# Patient Record
Sex: Female | Born: 1980 | Race: White | Hispanic: No | Marital: Single | Smoking: Former smoker
Health system: Southern US, Community
[De-identification: ages and names within clinical notes are randomized; demographics above are authoritative.]

## PROBLEM LIST (undated history)

## (undated) DIAGNOSIS — F419 Anxiety disorder, unspecified: Secondary | ICD-10-CM

## (undated) DIAGNOSIS — R87619 Unspecified abnormal cytological findings in specimens from cervix uteri: Secondary | ICD-10-CM

## (undated) DIAGNOSIS — F17200 Nicotine dependence, unspecified, uncomplicated: Secondary | ICD-10-CM

## (undated) DIAGNOSIS — A630 Anogenital (venereal) warts: Secondary | ICD-10-CM

## (undated) DIAGNOSIS — J029 Acute pharyngitis, unspecified: Secondary | ICD-10-CM

## (undated) DIAGNOSIS — K219 Gastro-esophageal reflux disease without esophagitis: Secondary | ICD-10-CM

## (undated) DIAGNOSIS — F32A Depression, unspecified: Secondary | ICD-10-CM

## (undated) DIAGNOSIS — F509 Eating disorder, unspecified: Secondary | ICD-10-CM

## (undated) DIAGNOSIS — F329 Major depressive disorder, single episode, unspecified: Secondary | ICD-10-CM

## (undated) DIAGNOSIS — N912 Amenorrhea, unspecified: Secondary | ICD-10-CM

## (undated) DIAGNOSIS — F1020 Alcohol dependence, uncomplicated: Secondary | ICD-10-CM

## (undated) DIAGNOSIS — J019 Acute sinusitis, unspecified: Secondary | ICD-10-CM

## (undated) HISTORY — DX: Acute pharyngitis, unspecified: J02.9

## (undated) HISTORY — DX: Eating disorder, unspecified: F50.9

## (undated) HISTORY — PX: COLPOSCOPY: SHX161

## (undated) HISTORY — DX: Anxiety disorder, unspecified: F41.9

## (undated) HISTORY — DX: Gastro-esophageal reflux disease without esophagitis: K21.9

## (undated) HISTORY — DX: Anogenital (venereal) warts: A63.0

## (undated) HISTORY — DX: Depression, unspecified: F32.A

## (undated) HISTORY — DX: Amenorrhea, unspecified: N91.2

## (undated) HISTORY — DX: Alcohol dependence, uncomplicated: F10.20

## (undated) HISTORY — PX: WISDOM TOOTH EXTRACTION: SHX21

## (undated) HISTORY — DX: Acute sinusitis, unspecified: J01.90

## (undated) HISTORY — DX: Nicotine dependence, unspecified, uncomplicated: F17.200

## (undated) HISTORY — DX: Unspecified abnormal cytological findings in specimens from cervix uteri: R87.619

## (undated) HISTORY — DX: Major depressive disorder, single episode, unspecified: F32.9

---
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
## 2001-01-30 ENCOUNTER — Other Ambulatory Visit: Admission: RE | Admit: 2001-01-30 | Discharge: 2001-01-30 | Payer: Self-pay | Admitting: Family Medicine

## 2001-07-30 ENCOUNTER — Emergency Department (HOSPITAL_COMMUNITY): Admission: EM | Admit: 2001-07-30 | Discharge: 2001-07-30 | Payer: Self-pay | Admitting: Emergency Medicine

## 2001-12-28 ENCOUNTER — Other Ambulatory Visit: Admission: RE | Admit: 2001-12-28 | Discharge: 2001-12-28 | Payer: Self-pay | Admitting: Family Medicine

## 2002-08-14 ENCOUNTER — Encounter: Payer: Self-pay | Admitting: Emergency Medicine

## 2002-08-14 ENCOUNTER — Emergency Department (HOSPITAL_COMMUNITY): Admission: EM | Admit: 2002-08-14 | Discharge: 2002-08-14 | Payer: Self-pay | Admitting: Emergency Medicine

## 2003-06-11 ENCOUNTER — Other Ambulatory Visit: Admission: RE | Admit: 2003-06-11 | Discharge: 2003-06-11 | Payer: Self-pay | Admitting: Family Medicine

## 2004-08-19 ENCOUNTER — Emergency Department (HOSPITAL_COMMUNITY): Admission: EM | Admit: 2004-08-19 | Discharge: 2004-08-19 | Payer: Self-pay | Admitting: *Deleted

## 2005-01-22 ENCOUNTER — Ambulatory Visit: Payer: Self-pay | Admitting: Family Medicine

## 2005-03-25 ENCOUNTER — Ambulatory Visit: Payer: Self-pay | Admitting: Family Medicine

## 2005-03-30 ENCOUNTER — Other Ambulatory Visit: Admission: RE | Admit: 2005-03-30 | Discharge: 2005-03-30 | Payer: Self-pay | Admitting: Obstetrics & Gynecology

## 2005-09-23 ENCOUNTER — Ambulatory Visit: Payer: Self-pay | Admitting: Family Medicine

## 2005-09-23 ENCOUNTER — Other Ambulatory Visit: Admission: RE | Admit: 2005-09-23 | Discharge: 2005-09-23 | Payer: Self-pay | Admitting: Family Medicine

## 2005-09-23 ENCOUNTER — Encounter: Payer: Self-pay | Admitting: Family Medicine

## 2005-11-23 ENCOUNTER — Ambulatory Visit: Payer: Self-pay | Admitting: Family Medicine

## 2005-12-20 ENCOUNTER — Ambulatory Visit: Payer: Self-pay | Admitting: Family Medicine

## 2005-12-28 ENCOUNTER — Ambulatory Visit: Payer: Self-pay | Admitting: Family Medicine

## 2005-12-30 ENCOUNTER — Encounter: Admission: RE | Admit: 2005-12-30 | Discharge: 2005-12-30 | Payer: Self-pay | Admitting: Family Medicine

## 2007-02-20 ENCOUNTER — Encounter: Payer: Self-pay | Admitting: Family Medicine

## 2007-02-20 ENCOUNTER — Other Ambulatory Visit: Admission: RE | Admit: 2007-02-20 | Discharge: 2007-02-20 | Payer: Self-pay | Admitting: Family Medicine

## 2007-02-20 ENCOUNTER — Ambulatory Visit: Payer: Self-pay | Admitting: Family Medicine

## 2007-02-20 LAB — CONVERTED CEMR LAB
ALT: 13 units/L (ref 0–40)
AST: 23 units/L (ref 0–37)
Albumin: 3.8 g/dL (ref 3.5–5.2)
Alkaline Phosphatase: 39 units/L (ref 39–117)
BUN: 12 mg/dL (ref 6–23)
Basophils Absolute: 0.1 10*3/uL (ref 0.0–0.1)
Basophils Relative: 0.8 % (ref 0.0–1.0)
Bilirubin, Direct: 0.2 mg/dL (ref 0.0–0.3)
CO2: 27 meq/L (ref 19–32)
Calcium: 9.1 mg/dL (ref 8.4–10.5)
Chloride: 105 meq/L (ref 96–112)
Cholesterol: 146 mg/dL (ref 0–200)
Creatinine, Ser: 0.8 mg/dL (ref 0.4–1.2)
Eosinophils Absolute: 0.3 10*3/uL (ref 0.0–0.6)
Eosinophils Relative: 3.1 % (ref 0.0–5.0)
GFR calc Af Amer: 113 mL/min
GFR calc non Af Amer: 94 mL/min
Glucose, Bld: 51 mg/dL — ABNORMAL LOW (ref 70–99)
HCT: 41.4 % (ref 36.0–46.0)
HDL: 80 mg/dL (ref >39.0)
Hemoglobin: 14.5 g/dL (ref 12.0–15.0)
LDL Cholesterol: 49 mg/dL (ref 0–99)
Lymphocytes Relative: 11.4 % — ABNORMAL LOW (ref 12.0–46.0)
MCHC: 34.9 g/dL (ref 30.0–36.0)
MCV: 92.4 fL (ref 78.0–100.0)
Monocytes Absolute: 0.3 10*3/uL (ref 0.2–0.7)
Monocytes Relative: 3.9 % (ref 3.0–11.0)
Neutro Abs: 7.2 10*3/uL (ref 1.4–7.7)
Neutrophils Relative %: 80.8 % — ABNORMAL HIGH (ref 43.0–77.0)
Platelets: 279 10*3/uL (ref 150–400)
Potassium: 3.8 meq/L (ref 3.5–5.1)
RBC: 4.48 M/uL (ref 3.87–5.11)
RDW: 12.3 % (ref 11.5–14.6)
Sodium: 138 meq/L (ref 135–145)
TSH: 1.04 microintl units/mL (ref 0.35–5.50)
Total Bilirubin: 0.6 mg/dL (ref 0.3–1.2)
Total CHOL/HDL Ratio: 1.8
Total Protein: 6.5 g/dL (ref 6.0–8.3)
Triglycerides: 83 mg/dL (ref 0–149)
VLDL: 17 mg/dL (ref 0–40)
WBC: 8.9 10*3/uL (ref 4.5–10.5)

## 2007-12-14 ENCOUNTER — Ambulatory Visit: Payer: Self-pay | Admitting: Internal Medicine

## 2007-12-14 DIAGNOSIS — N912 Amenorrhea, unspecified: Secondary | ICD-10-CM | POA: Insufficient documentation

## 2007-12-14 LAB — CONVERTED CEMR LAB: Beta hcg, urine, semiquantitative: POSITIVE

## 2008-01-04 ENCOUNTER — Encounter: Payer: Self-pay | Admitting: Internal Medicine

## 2008-01-08 ENCOUNTER — Telehealth (INDEPENDENT_AMBULATORY_CARE_PROVIDER_SITE_OTHER): Payer: Self-pay | Admitting: *Deleted

## 2008-02-13 ENCOUNTER — Telehealth (INDEPENDENT_AMBULATORY_CARE_PROVIDER_SITE_OTHER): Payer: Self-pay | Admitting: *Deleted

## 2008-02-13 ENCOUNTER — Ambulatory Visit: Payer: Self-pay | Admitting: Internal Medicine

## 2008-02-13 DIAGNOSIS — J029 Acute pharyngitis, unspecified: Secondary | ICD-10-CM | POA: Insufficient documentation

## 2008-02-13 DIAGNOSIS — J019 Acute sinusitis, unspecified: Secondary | ICD-10-CM | POA: Insufficient documentation

## 2008-02-13 LAB — CONVERTED CEMR LAB: Rapid Strep: NEGATIVE

## 2008-04-01 ENCOUNTER — Telehealth (INDEPENDENT_AMBULATORY_CARE_PROVIDER_SITE_OTHER): Payer: Self-pay | Admitting: *Deleted

## 2009-04-10 ENCOUNTER — Encounter: Payer: Self-pay | Admitting: Family Medicine

## 2009-04-11 ENCOUNTER — Encounter: Payer: Self-pay | Admitting: Family Medicine

## 2009-04-11 ENCOUNTER — Ambulatory Visit: Payer: Self-pay | Admitting: Family Medicine

## 2009-04-11 ENCOUNTER — Other Ambulatory Visit: Admission: RE | Admit: 2009-04-11 | Discharge: 2009-04-11 | Payer: Self-pay | Admitting: Family Medicine

## 2009-04-13 LAB — CONVERTED CEMR LAB
ALT: 11 units/L (ref 0–35)
AST: 20 units/L (ref 0–37)
Albumin: 4.1 g/dL (ref 3.5–5.2)
Alkaline Phosphatase: 35 units/L — ABNORMAL LOW (ref 39–117)
BUN: 10 mg/dL (ref 6–23)
Basophils Absolute: 0 10*3/uL (ref 0.0–0.1)
Basophils Relative: 0 % (ref 0.0–3.0)
Bilirubin, Direct: 0 mg/dL (ref 0.0–0.3)
CO2: 29 meq/L (ref 19–32)
Calcium: 9.3 mg/dL (ref 8.4–10.5)
Chloride: 106 meq/L (ref 96–112)
Cholesterol: 163 mg/dL (ref 0–200)
Creatinine, Ser: 0.7 mg/dL (ref 0.4–1.2)
Eosinophils Absolute: 0.2 10*3/uL (ref 0.0–0.7)
Eosinophils Relative: 3.1 % (ref 0.0–5.0)
GFR calc non Af Amer: 107.04 mL/min (ref >60)
Glucose, Bld: 76 mg/dL (ref 70–99)
HCT: 41.3 % (ref 36.0–46.0)
HDL: 75.8 mg/dL (ref >39.00)
Hemoglobin: 14.3 g/dL (ref 12.0–15.0)
LDL Cholesterol: 61 mg/dL (ref 0–99)
Lymphocytes Relative: 26 % (ref 12.0–46.0)
Lymphs Abs: 1.4 10*3/uL (ref 0.7–4.0)
MCHC: 34.7 g/dL (ref 30.0–36.0)
MCV: 91.5 fL (ref 78.0–100.0)
Monocytes Absolute: 0.5 10*3/uL (ref 0.1–1.0)
Monocytes Relative: 8.2 % (ref 3.0–12.0)
Neutro Abs: 3.4 10*3/uL (ref 1.4–7.7)
Neutrophils Relative %: 62.7 % (ref 43.0–77.0)
Platelets: 257 10*3/uL (ref 150.0–400.0)
Potassium: 3.7 meq/L (ref 3.5–5.1)
RBC: 4.52 M/uL (ref 3.87–5.11)
RDW: 12.1 % (ref 11.5–14.6)
Sodium: 139 meq/L (ref 135–145)
TSH: 1.3 microintl units/mL (ref 0.35–5.50)
Total Bilirubin: 0.8 mg/dL (ref 0.3–1.2)
Total CHOL/HDL Ratio: 2
Total Protein: 7.5 g/dL (ref 6.0–8.3)
Triglycerides: 132 mg/dL (ref 0.0–149.0)
VLDL: 26.4 mg/dL (ref 0.0–40.0)
WBC: 5.5 10*3/uL (ref 4.5–10.5)

## 2009-04-14 ENCOUNTER — Encounter (INDEPENDENT_AMBULATORY_CARE_PROVIDER_SITE_OTHER): Payer: Self-pay | Admitting: *Deleted

## 2009-04-15 ENCOUNTER — Encounter (INDEPENDENT_AMBULATORY_CARE_PROVIDER_SITE_OTHER): Payer: Self-pay | Admitting: *Deleted

## 2009-04-15 LAB — CONVERTED CEMR LAB
Mumps IgG: 4 — ABNORMAL HIGH
Rubella: 39.5 intl units/mL — ABNORMAL HIGH
Rubeola IgG: 3.16 — ABNORMAL HIGH
Varicella IgG: 3.94 — ABNORMAL HIGH

## 2009-05-01 ENCOUNTER — Ambulatory Visit: Payer: Self-pay | Admitting: Family Medicine

## 2009-05-01 ENCOUNTER — Telehealth (INDEPENDENT_AMBULATORY_CARE_PROVIDER_SITE_OTHER): Payer: Self-pay | Admitting: *Deleted

## 2009-12-03 ENCOUNTER — Telehealth (INDEPENDENT_AMBULATORY_CARE_PROVIDER_SITE_OTHER): Payer: Self-pay | Admitting: *Deleted

## 2009-12-03 ENCOUNTER — Encounter (INDEPENDENT_AMBULATORY_CARE_PROVIDER_SITE_OTHER): Payer: Self-pay | Admitting: *Deleted

## 2010-01-26 ENCOUNTER — Other Ambulatory Visit: Admission: RE | Admit: 2010-01-26 | Discharge: 2010-01-26 | Payer: Self-pay | Admitting: Family Medicine

## 2010-01-26 ENCOUNTER — Ambulatory Visit: Payer: Self-pay | Admitting: Family Medicine

## 2010-01-26 DIAGNOSIS — F17200 Nicotine dependence, unspecified, uncomplicated: Secondary | ICD-10-CM | POA: Insufficient documentation

## 2010-01-26 LAB — CONVERTED CEMR LAB
ALT: 12 units/L (ref 0–35)
AST: 17 units/L (ref 0–37)
Albumin: 4 g/dL (ref 3.5–5.2)
Alkaline Phosphatase: 45 units/L (ref 39–117)
BUN: 12 mg/dL (ref 6–23)
Basophils Absolute: 0 10*3/uL (ref 0.0–0.1)
Basophils Relative: 0.3 % (ref 0.0–3.0)
Bilirubin, Direct: 0.1 mg/dL (ref 0.0–0.3)
CO2: 29 meq/L (ref 19–32)
Calcium: 9.5 mg/dL (ref 8.4–10.5)
Chloride: 108 meq/L (ref 96–112)
Cholesterol: 149 mg/dL (ref 0–200)
Creatinine, Ser: 0.8 mg/dL (ref 0.4–1.2)
Eosinophils Absolute: 0.2 10*3/uL (ref 0.0–0.7)
Eosinophils Relative: 2.3 % (ref 0.0–5.0)
GFR calc non Af Amer: 91.21 mL/min (ref >60)
Glucose, Bld: 88 mg/dL (ref 70–99)
HCT: 41.1 % (ref 36.0–46.0)
HDL: 72.7 mg/dL (ref >39.00)
Hemoglobin: 13.7 g/dL (ref 12.0–15.0)
LDL Cholesterol: 52 mg/dL (ref 0–99)
Lymphocytes Relative: 18.2 % (ref 12.0–46.0)
Lymphs Abs: 1.4 10*3/uL (ref 0.7–4.0)
MCHC: 33.3 g/dL (ref 30.0–36.0)
MCV: 92.4 fL (ref 78.0–100.0)
Monocytes Absolute: 0.6 10*3/uL (ref 0.1–1.0)
Monocytes Relative: 7.3 % (ref 3.0–12.0)
Neutro Abs: 5.4 10*3/uL (ref 1.4–7.7)
Neutrophils Relative %: 71.9 % (ref 43.0–77.0)
Platelets: 331 10*3/uL (ref 150.0–400.0)
Potassium: 4.4 meq/L (ref 3.5–5.1)
RBC: 4.45 M/uL (ref 3.87–5.11)
RDW: 11.9 % (ref 11.5–14.6)
Sodium: 141 meq/L (ref 135–145)
TSH: 1.05 microintl units/mL (ref 0.35–5.50)
Total Bilirubin: 0.3 mg/dL (ref 0.3–1.2)
Total CHOL/HDL Ratio: 2
Total Protein: 7 g/dL (ref 6.0–8.3)
Triglycerides: 120 mg/dL (ref 0.0–149.0)
VLDL: 24 mg/dL (ref 0.0–40.0)
WBC: 7.6 10*3/uL (ref 4.5–10.5)

## 2010-01-28 ENCOUNTER — Encounter (INDEPENDENT_AMBULATORY_CARE_PROVIDER_SITE_OTHER): Payer: Self-pay | Admitting: *Deleted

## 2010-01-28 LAB — CONVERTED CEMR LAB: Pap Smear: NEGATIVE

## 2010-03-17 ENCOUNTER — Telehealth: Payer: Self-pay | Admitting: Family Medicine

## 2010-07-15 ENCOUNTER — Encounter: Admission: RE | Admit: 2010-07-15 | Discharge: 2010-07-15 | Payer: Self-pay | Admitting: Internal Medicine

## 2011-01-05 NOTE — Progress Notes (Signed)
Summary: [facility] IMMUNIZATION  FORM  Phone Note Call from Patient Call back at Home Phone [phone]   Caller: Patient Summary of Call: PATIENT DROPPED OFF [employer] I[facility]UN[facility] FORM TO BE FILLED OUT---PLEASE MAIL COMPLETED FORM TO HER  PLEASE BRING PHYSICIAN SHEET AND COPY OF FIRST PAGE BACK TO [facility] WHETHER THERE IS A CHARGE OR NOT Initial call taken by: Jerolyn Shin,  May 01, 2009 10:51 AM  Follow-up for Phone Call        Patient aware mailed copy of immunizations and cpx. Ardyth Man  May 01, 2009 11:45 AM  Follow-up by: Ardyth Man,  May 01, 2009 11:45 AM

## 2011-01-05 NOTE — Letter (Signed)
Summary: Results Follow-up Letter  [place] [facility]  [address] [place], [place] [address]   Phone: [phone]  Fax: [phone]    04/15/2009        Heather Mclean [address] [place], [place]  [address]  Dear [name],   The following are the results of your recent test(s):  Test     Result     Pap Smear    Normal_XXX______  Not Normal_____       Comments: _________________________________________________________ Cholesterol LDL(Bad cholesterol):          Your goal is less than:         HDL (Good cholesterol):        Your goal is more than: _________________________________________________________ Other Tests:   _________________________________________________________  Please call for an appointment Or _________________________________________________________ _________________________________________________________ _________________________________________________________  Sincerely,  Ardyth Man [place] at [facility]

## 2011-01-05 NOTE — Letter (Signed)
Summary: Results Follow-up Letter  [place] [facility] [place]  [address] [place], [place] [address]   Phone: [phone]  Fax: [phone]    04/14/2009        Heather Mclean [address] [place], [place]  [address]  Dear Ms. Heather Mclean,   The following are the results of your recent test(s):  Test     Result     Pap Smear    Normal_______  Not Normal_____       Comments: _________________________________________________________ Cholesterol LDL(Bad cholesterol):          Your goal is less than:         HDL (Good cholesterol):        Your goal is more than: _________________________________________________________ Other Tests:   _________________________________________________________  Please call for an appointment Or __Please see attached labwork._______________________________________________________ _________________________________________________________ _________________________________________________________  Sincerely,  Ardyth Man [place] at [facility]

## 2011-01-05 NOTE — Assessment & Plan Note (Signed)
Summary: pregant//tl   Vital Signs:  Patient Profile:   30 Years Old Female LMP:     10/31/2007 Weight:      126.2 pounds Pulse rate:   60 / minute BP sitting:   120 / 80  Vitals Entered By: Shary Decamp (December 14, 2007 11:10 AM)  Menstrual History: LMP (date): 10/31/2007 EDC by LMP: 08/06/2008                 Chief Complaint:  home pregnancy test was +.  History of Present Illness: pregnant  Current Allergies (reviewed today): No known allergies   Past Medical History:    G1P0   Family History:    DM--no    HTN--no  Social History:    has a boy friend   Risk Factors:  Tobacco use:  current Alcohol use:  yes    Comments:  sometimes   Review of Systems  GI      Denies abdominal pain, nausea, and vomiting.  GU      no spotting no vag. d/c LMP Nov. 25   Physical Exam  General:     alert and well-developed.   Lungs:     normal respiratory effort, no intercostal retractions, no accessory muscle use, and normal breath sounds.   Heart:     normal rate, regular rhythm, and no murmur.   Abdomen:     soft, non-tender, no hepatomegaly, and no splenomegaly.      Impression & Recommendations:  Problem # 1:  AMENORRHEA (ICD-626.0) On BCP up to mid December +preg test here Rec: Ob referal D/C TOBACCO PNV starting today Orders: Urine Pregnancy Test  (81191) Obstetric Referral (Obstetric)   Problem # 2:  pt's cell 336  [phone]     ] Laboratory Results   Urine Tests      Urine HCG: positive Comments: ..................................................................Marland KitchenShary Decamp  December 14, 2007 11:15 AM

## 2011-01-05 NOTE — Assessment & Plan Note (Signed)
Summary: CPX, fasting labs and pap/scm/n/s   Vital Signs:  Patient profile:   30 year old female Height:      65 inches Weight:      129 pounds BMI:     21.54 Temp:     97.9 degrees F oral Pulse rate:   67 / minute Pulse rhythm:   regular BP sitting:   122 / 80  (left arm) Cuff size:   regular  Vitals Entered By: Army Fossa CMA (January 26, 2010 10:19 AM) CC: CPX, pap   History of Present Illness: Pt here for cpe and pap. No complaints.    Preventive Screening-Counseling & Management  Alcohol-Tobacco     Alcohol drinks/day: 1     Alcohol type: all     Smoking Status: current     Smoking Cessation Counseling: yes     Smoke Cessation Stage: ready     Packs/Day: 1     Year Started: 2000  Caffeine-Diet-Exercise     Caffeine use/day: 2     Does Patient Exercise: yes     Type of exercise: treadmill,  walking     Exercise (avg: min/session): 30-60     Times/week: <3  Hep-HIV-STD-Contraception     Dental Visit-last 6 months no     Dental Care Counseling: to seek dental care; no dental care within six months     SBE monthly: yes     SBE Education/Counseling: to perform regular SBE      Sexual History:  currently monogamous and single.        Drug Use:  no.    Current Medications (verified): 1)  Yaz 3-0.02 Mg  Tabs (Drospirenone-Ethinyl Estradiol) .... Take One Tablet Daily-Needs Appt For Further Refills 2)  Promethazine-Codeine 6.25-10 Mg/51ml  Syrp (Promethazine-Codeine) .Marland Kitchen.. 1 Tsp Q 6 Hrs As Needed Cough 3)  Chantix Continuing Month Pak 1 Mg Tabs (Varenicline Tartrate) .... As Directed 4)  Drysol 20 % Soln (Aluminum Chloride) .... 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 DM--no HTN--no  Social History: Reviewed history from 12/14/2007 and no changes required. has a boy friend Occupation:  Patent examiner,  Manufacturing engineer Single Current Smoker Alcohol use-yes Drug use-no Regular exercise-yes Caffeine use/day:  2 Does Patient Exercise:  yes Dental Care w/in 6 mos.:  no Sexual History:  currently monogamous, single Occupation:  employed Drug Use:  no  Review of Systems      See HPI General:  Denies chills, fatigue, fever, loss of appetite, malaise, sleep disorder, sweats, weakness, and weight loss. Eyes:  Denies blurring, discharge, double vision, eye irritation, eye pain, halos, itching, light sensitivity, red eye, vision loss-1 eye, and vision loss-both eyes; optho-- last year. ENT:  Denies decreased hearing, difficulty swallowing, ear discharge, earache, hoarseness, nasal congestion, nosebleeds, postnasal drainage, ringing in ears, sinus pressure, and sore throat. CV:  Denies bluish discoloration of lips or nails, chest pain or discomfort, difficulty breathing at night, difficulty breathing while lying  down, fainting, fatigue, leg cramps with exertion, lightheadness, near fainting, palpitations, shortness of breath with exertion, swelling of feet, swelling of hands, and weight gain. Resp:  Denies chest discomfort, chest pain with inspiration, cough, coughing up blood, excessive snoring, hypersomnolence, morning headaches, pleuritic, shortness of breath, sputum productive, and wheezing. GI:  Denies abdominal pain, bloody stools, change in bowel habits, constipation, dark tarry stools,  diarrhea, excessive appetite, gas, hemorrhoids, indigestion, loss of appetite, nausea, vomiting, vomiting blood, and yellowish skin color. GU:  Denies abnormal vaginal bleeding, decreased libido, discharge, dysuria, genital sores, hematuria, incontinence, nocturia, urinary frequency, and urinary hesitancy. MS:  Denies joint pain, joint redness, joint swelling, loss of strength, low back pain, mid back pain, muscle aches, muscle , cramps, muscle weakness, stiffness, and thoracic pain. Derm:  Denies changes in color of skin, changes in nail beds, dryness, excessive perspiration, flushing, hair loss, insect bite(s), itching, lesion(s), poor wound healing, and rash. Neuro:  Denies brief paralysis, difficulty with concentration, disturbances in coordination, falling down, headaches, inability to speak, memory loss, numbness, poor balance, seizures, sensation of room spinning, tingling, tremors, visual disturbances, and weakness. Psych:  Denies alternate hallucination ( auditory/visual), anxiety, depression, easily angered, easily tearful, irritability, mental problems, panic attacks, sense of great danger, suicidal thoughts/plans, thoughts of violence, unusual visions or sounds, and thoughts /plans of harming others. Endo:  Denies cold intolerance, excessive hunger, excessive thirst, excessive urination, heat intolerance, polyuria, and weight change. Heme:  Denies abnormal bruising, bleeding, enlarge lymph nodes, fevers, pallor, and skin discoloration. Allergy:  Denies hives or rash, itching eyes, persistent infections, seasonal allergies, and sneezing.  Physical Exam  General:  Well-developed,well-nourished,in no acute distress; alert,appropriate and cooperative throughout examination Head:  Normocephalic and atraumatic without obvious abnormalities. No apparent alopecia or balding. Eyes:  vision grossly intact, pupils equal, pupils round, pupils reactive to light, and no injection.   Ears:  External ear  exam shows no significant lesions or deformities.  Otoscopic examination reveals clear canals, tympanic membranes are intact bilaterally without bulging, retraction, inflammation or discharge. Hearing is grossly normal bilaterally. Nose:  External nasal examination shows no deformity or inflammation. Nasal mucosa are pink and moist without lesions or exudates. Mouth:  Oral mucosa and oropharynx without lesions or exudates.  Teeth in good repair. Neck:  No deformities, masses, or tenderness noted. Chest Wall:  No deformities, masses, or tenderness noted. Breasts:  No mass, nodules, thickening, tenderness, bulging, retraction, inflamation, nipple discharge or skin changes noted.   Lungs:  Normal respiratory effort, chest expands symmetrically. Lungs are clear to auscultation, no crackles or wheezes. Heart:  normal rate and no murmur.   Abdomen:  Bowel sounds positive,abdomen soft and non-tender without masses, organomegaly or hernias noted. Genitalia:  Pelvic Exam:        External: normal female genitalia without lesions or masses        Vagina: normal without lesions or masses        Cervix: normal without lesions or masses        Adnexa: normal bimanual exam without masses or fullness        Uterus: normal by palpation        Pap smear: performed Msk:  normal ROM, no joint tenderness, no joint swelling, no joint warmth, no redness over joints, no joint deformities, no joint instability, and no crepitation.   Pulses:  R and L carotid,radial,femoral,dorsalis pedis and posterior tibial pulses are full and equal bilaterally Extremities:  No clubbing, cyanosis, edema, or deformity noted with  normal full range of motion of all joints.   Neurologic:  No cranial nerve deficits noted. Station and gait are normal. Plantar reflexes are down-going bilaterally. DTRs are symmetrical throughout. Sensory, motor and coordinative functions appear intact. Skin:  Intact without suspicious lesions or rashes Cervical  Nodes:  No lymphadenopathy noted Axillary Nodes:  No palpable lymphadenopathy Psych:  Cognition and judgment appear intact. Alert and cooperative with normal attention span and concentration. No apparent delusions, illusions, hallucinations   Impression & Recommendations:  Problem # 1:  PREVENTIVE HEALTH CARE (ICD-V70.0)  Orders: Venipuncture ([address]) TLB-Lipid Panel (80061-LIPID) TLB-BMP (Basic Metabolic Panel-BMET) (80048-METABOL) TLB-CBC Platelet - w/Differential (85025-CBCD) TLB-Hepatic/Liver Function Pnl (80076-HEPATIC) TLB-TSH (Thyroid Stimulating Hormone) (84443-TSH) T-HIV Antibody  (Reflex) ([address]-09811) T-RPR (Syphilis) ([address]1478-29562) T- * Misc. Laboratory test [phone])  Problem # 2:  TOBACCO USER (ICD-305.1)  Her updated medication list for this problem includes:    Chantix Continuing Month Pak 1 Mg Tabs (Varenicline tartrate) .Marland Kitchen... As directed  Encouraged smoking cessation and discussed different methods for smoking cessation.   Orders: Tobacco use cessation intermediate 3-10 minutes ([address])  Complete Medication List: 1)  Yaz 3-0.02 Mg Tabs (Drospirenone-ethinyl estradiol) .... Take one tablet daily-needs appt for further refills 2)  Promethazine-codeine 6.25-10 Mg/84ml Syrp (Promethazine-codeine) .Marland Kitchen.. 1 tsp q 6 hrs as needed cough 3)  Chantix Continuing Month Pak 1 Mg Tabs (Varenicline tartrate) .... As directed 4)  Drysol 20 % Soln (Aluminum chloride) .... Apply at bedtime as needed Prescriptions: DRYSOL 20 % SOLN (ALUMINUM CHLORIDE) apply at bedtime as needed  #1 month x 2   Entered and Authorized by:   Loreen Freud DO   Signed by:   Loreen Freud DO on 01/26/2010   Method used:   Print then Give to Patient   RxID:   [id number] CHANTIX CONTINUING MONTH PAK 1 MG TABS (VARENICLINE TARTRATE) as directed  #1 x 3   Entered and Authorized by:   Loreen Freud DO   Signed by:   Loreen Freud DO on 01/26/2010   Method used:   Print then Give to Patient   RxID:    [id number]   Flu Vaccine Next Due:  Refused    Immunization History:  Tetanus/Td Immunization History:    Tetanus/Td:  Historical (02/20/2007)  Appended Document: CPX, fasting labs and pap/scm/n/s  Laboratory Results   Urine Tests   Date/Time Reported: January 26, 2010 1:31 PM   Routine Urinalysis   Color: yellow Appearance: Clear Glucose: negative   (Normal Range: Negative) Bilirubin: negative   (Normal Range: Negative) Ketone: negative   (Normal Range: Negative) Spec. Gravity: 1.020   (Normal Range: 1.003-1.035) Blood: negative   (Normal Range: Negative) pH: 5.0   (Normal Range: 5.0-8.0) Protein: negative   (Normal Range: Negative) Urobilinogen: negative   (Normal Range: 0-1) Nitrite: negative   (Normal Range: Negative) Leukocyte Esterace: negative   (Normal Range: Negative)    Comments: Floydene Flock  January 26, 2010 1:31 PM

## 2011-01-05 NOTE — Progress Notes (Signed)
Summary: rx yaz- dr lowne  Phone Note Call from Patient Call back at Home Phone [phone]   Caller: Patient Summary of Call: patient was given samples of jaz for birth control - wants rx called in - [facility] [address]  Initial call taken by: Okey Regal [place],  April 01, 2008 1:41 PM  Follow-up for Phone Call        rx sent electronically and patient aware ...................................................................Ardyth Man  April 02, 2008 8:01 AM  Follow-up by: Ardyth Man,  April 02, 2008 8:01 AM      Prescriptions: YAZ 3-0.02 MG  TABS (DROSPIRENONE-ETHINYL ESTRADIOL) take one tablet daily  #28 x 3   Entered by:   Ardyth Man   Authorized by:   Loreen Freud DO   Signed by:   Ardyth Man on 04/02/2008   Method used:   Electronically sent to ...       [facility] # [id number]*       [address]       [place], [place]  [address]       Ph: [phone]       Fax: [phone]   RxID:   [id number]

## 2011-01-05 NOTE — Progress Notes (Signed)
Summary: YAZ  Phone Note Refill Request Call back at Home Phone [phone] Message from:  Patient  Refills Requested: Medication #1:  YAZ 3-0.02 MG  TABS take one tablet daily [facility] [address]--PH--[phone]  Initial call taken by: Freddy Jaksch,  December 03, 2009 10:35 AM    New/Updated Medications: YAZ 3-0.02 MG  TABS (DROSPIRENONE-ETHINYL ESTRADIOL) take one tablet daily-NEEDS APPT FOR FURTHER REFILLS RXPrescriptions: YAZ 3-0.02 MG  TABS (DROSPIRENONE-ETHINYL ESTRADIOL) take one tablet daily-NEEDS APPT FOR FURTHER REFILLS  #28 x 0   Entered by:   Doristine Devoid   Authorized by:   Loreen Freud DO   Signed by:   Doristine Devoid on 12/03/2009   Method used:   Electronically to        [facility] # [id number]* (retail)       [address]       [place], [place]  [address]       Ph: [phone]       Fax: [phone]   RxID:   [id number]

## 2011-01-05 NOTE — Progress Notes (Signed)
Summary: change birth control- dr Marice Potter up  Phone Note Call from Patient Call back at Home Phone [phone]   Caller: Patient Summary of Call: patient would like birth control med to be changed from -orthotricycline to yaz - [facility] [address] - [phone] Initial call taken by: Okey Regal [place],  January 08, 2008 10:59 AM  Follow-up for Phone Call        left message on machine need to speak with pt ..................................................................Marland KitchenDoristine Devoid  January 08, 2008 4:17 PM   spoke with pt informed that she is no longer pregnant also let her know that will get office visit notes from dr Arlyce Dice at [phone] ..................................................................Marland KitchenDoristine Devoid  January 08, 2008 5:01 PM   Additional Follow-up for Phone Call Additional follow up Details #1::        called dr Arlyce Dice office waiting on nurse to callback so that we can have information faxed ..................................................................Marland KitchenDoristine Devoid  January 09, 2008 10:22 AM   spoke with dr Marzetta Board nurse said that the only thing they did for pt was pap and she had positive pregancy no other mention of pregnacy at visit ..................................................................Marland KitchenDoristine Devoid  January 09, 2008 11:20 AM     Additional Follow-up for Phone Call Additional follow up Details #2::    left message on machine pt needs office visit with dr Laury Axon before prescribing medication will schedule for tomorrow at 8:30 if not avaiable then ASAP ..................................................................Marland KitchenDoristine Devoid  January 09, 2008 11:41 AM   spoke with pt a little upset that she has to come in for office visit explain to pt that due to her situation we cant just prescribe bcp without doing our own exam she said that she would be unable to come in this week between work and school also she then stated that she had  abortion 2 weeks ago so her pregnancy test is still going to come out positive and that she needs prescription so she doesn't get into the same situation so phone call was ended and still no appt scheduled. ..................................................................Marland KitchenDoristine Devoid  January 09, 2008 1:27 PM   Additional Follow-up for Phone Call Additional follow up Details #3:: Details for Additional Follow-up Action Taken: Yaz #1 2 refills---- We need a copy of pap. Additional Follow-up by: Loreen Freud DO,  January 09, 2008 1:58 PM  New/Updated Medications: YAZ 3-0.02 MG  TABS (DROSPIRENONE-ETHINYL ESTRADIOL) take one tablet daily   Prescriptions: YAZ 3-0.02 MG  TABS (DROSPIRENONE-ETHINYL ESTRADIOL) take one tablet daily  #28 x 2   Entered by:   Doristine Devoid   Authorized by:   Loreen Freud DO   Signed by:   Doristine Devoid on 01/09/2008   Method used:   Electronically sent to ...       [facility] # [id number]*       [address]       [place], [place]  [address]       Ph: [phone]       Fax: [phone]   RxID:   [id number]

## 2011-01-05 NOTE — Progress Notes (Signed)
Summary: PT WANTS TO BE SEEN TODAY FOR STREP THROAT/left msg   Phone Note Call from Patient Call back at Home Phone [phone]   Caller: Patient Reason for Call: Acute Illness, Talk to Nurse Summary of Call: DR LOWNE// PT CALLED AND WANTS TO BE SEEN TODAY FOR STREP THROAT. PT HAS BEEN HAVING SORETHROAT, COUGHING PT SAYS IT HURTS WHEN SHE SWALLOWS AND GLANDS ARE SWOLLEN ALSO RUNNING A FEVER OF 100 X 2 DAYS. Initial call taken by: Job Founds,  February 13, 2008 8:39 AM  Follow-up for Phone Call        left message to call...................................................................Marland KitchenKandice Hams  February 13, 2008 9:52 AM called pt back offered appt today with dr hopper ov sched...................................................................Marland KitchenKandice Hams  February 13, 2008 10:14 AM   Follow-up by: Kandice Hams,  February 13, 2008 9:53 AM

## 2011-01-05 NOTE — Progress Notes (Signed)
Summary: REFILL YAZ  Phone Note Call from Patient Call back at Home Phone [phone]   Caller: Patient Call For: Loreen Freud DO Reason for Call: Refill Medication Summary of Call: PATIENT CALLING, REQUESTS REFILL OF YAZ & SUBMIT TO [facility], PHONE [phone],  OK TO LEAVE PATIENT VM PER PT. Initial call taken by: Magdalen Spatz [facility],  March 17, 2010 10:49 AM  Follow-up for Phone Call        Okay to refill, had pap in 01/2010. Army Fossa CMA  March 17, 2010 10:55 AM   Additional Follow-up for Phone Call Additional follow up Details #1::        ok to refill until Feb Additional Follow-up by: Loreen Freud DO,  March 17, 2010 10:59 AM    Additional Follow-up for Phone Call Additional follow up Details #2::    left voicemail rx was sent in. Army Fossa CMA  March 17, 2010 11:05 AM   Prescriptions: YAZ 3-0.02 MG  TABS (DROSPIRENONE-ETHINYL ESTRADIOL) take one tablet daily-NEEDS APPT FOR FURTHER REFILLS  #28.0 Each x 10   Entered by:   Army Fossa CMA   Authorized by:   Loreen Freud DO   Signed by:   Army Fossa CMA on 03/17/2010   Method used:   Printed then faxed to ...       [facility] # [phone]* (retail)       [address]       [place], [place]  [address]       Ph: [phone]       Fax: [phone]   RxID:   [id number]  FAxed to [phone]- [facility].

## 2011-01-05 NOTE — Letter (Signed)
Summary: Results Follow up Letter  [place] [facility] [place]  [address] [place], [place] [address]   Phone: [phone]  Fax: [phone]    01/28/2010 MRN: [id number]  [facility] [address] [place], [place]  [address]  Dear [name],  The following are the results of your recent test(s):  Test         Result    Pap Smear:        Normal __X___  Not Normal _____ Comments: ______________________________________________________ Cholesterol: LDL(Bad cholesterol):         Your goal is less than:         HDL (Good cholesterol):       Your goal is more than: Comments:  ______________________________________________________ Mammogram:        Normal _____  Not Normal _____ Comments:  ___________________________________________________________________ Hemoccult:        Normal _____  Not normal _______ Comments:    _____________________________________________________________________ Other Tests:    We routinely do not discuss normal results over the telephone.  If you desire a copy of the results, or you have any questions about this information we can discuss them at your next office visit.   Sincerely,    Army Fossa CMA  January 28, 2010 2:34 PM

## 2011-01-05 NOTE — Assessment & Plan Note (Signed)
Summary: CPX WITH PAP/ALR   Vital Signs:  Patient profile:   30 year old female Menstrual status:  regular Height:      66 inches Weight:      127.38 pounds BMI:     20.63 Temp:     98.2 degrees F oral Pulse rate:   72 / minute Resp:     16 per minute BP sitting:   110 / 80  (left arm)  Vitals Entered By: Ardyth Man (Apr 11, 2009 8:37 AM) CC: CPX-FASTING LABS (CREAM W/COFFEE) PAP SMEAR Is Patient Diabetic? No Pain Assessment Patient in pain? no       Have you ever been in a relationship where you felt threatened, hurt or afraid?No   Does patient need assistance? Functional Status Self care Ambulation Normal  years   days  Menstrual Status regular   History of Present Illness: Pt here for CPE , pap and labs.  Pt with no complaints.  She is going to [employer] for accounting in fall.    Preventive Screening-Counseling & Management     Alcohol drinks/day: 1     Alcohol type: beer, wine     >5/day in last 3 mos: yes     Alcohol Counseling: to decrease amount and/or frequency of alcohol intake     Feels need to cut down: no     Feels annoyed by complaints: no     Feels guilty re: drinking: no     Needs 'eye opener' in am: no     Smoking Status: current     Smoking Cessation Counseling: yes     Smoke Cessation Stage: ready     Packs/Day: 1.0     Year Started: 2000     Pack years: 10     Passive Smoke Exposure: yes     Caffeine use/day: 2     Does Patient Exercise: yes     Type of exercise: walking,  treadmill     Exercise (avg: min/session): <30     Times/week: <3     Hepatitis Risk: no risk noted     Hepatitis Risk Counseling: not indicated-no hepatitis risk noted     HIV Risk: no risk noted     HIV Risk Counseling: not indicated-no HIV risk noted     STD Risk: no risk noted     STD Risk Counseling: not indicated-no STD risk noted     Contraception Counseling: questions answered     Dental Visit-last 6 months no     Dental Care Counseling: to seek dental  care; no dental care within six months     SBE monthly: yes     Sun Exposure-Excessive: no     Seat Belt Use: yes     Firearms in the Home: no firearms in the home     Smoke Detectors: yes     Violence in the Home: no risk noted     Sexual Abuse: no      Sexual History:  currently monogamous.        Drug Use:  never and no.        Blood Transfusions:  no.    Problems Prior to Update: 1)  Preventive Health Care  (ICD-V70.0)  Medications Prior to Update: 1)  None  Current Medications (verified): 1)  Yaz 3-0.02 Mg Tabs (Drospirenone-Ethinyl Estradiol) .... Take As Directed  Allergies (verified): No Known Drug Allergies  Past History:  Family History:    MOTHER-HEALTHY  F--sleep apnea, gout, smoker (04/11/2009)  Social History:    Occupation: WAITRESS--- Patent examiner    Single    Current Smoker    Alcohol use-yes    Drug use-no    Regular exercise-yes     (04/11/2009)  Risk Factors:    Alcohol Use: 1 (04/11/2009)    >5 drinks/d w/in last 3 months: yes (04/11/2009)    Caffeine Use: 2 (04/11/2009)    Diet: N/A    Exercise: yes (04/11/2009)  Risk Factors:    Smoking Status: current (04/11/2009)    Packs/Day: 1.0 (04/11/2009)    Cigars/wk: N/A    Pipe Use/wk: N/A    Cans of tobacco/wk: N/A    Passive Smoke Exposure: yes (04/11/2009)  Past Medical History:    IN HIGH SCHOOL-ABNORMAL CELLS VAGINAL BIOPSY-LOW GRADE HPV-REMOVED    TWO ABORTIONS  Past Surgical History:    TWO ABORTIONS  Family History:    MOTHER-HEALTHY    F--sleep apnea, gout, smoker  Social History:    Occupation: WAITRESS--- Patent examiner    Single    Current Smoker    Alcohol use-yes    Drug use-no    Regular exercise-yes    Occupation:  employed    Smoking Status:  current    Drug Use:  never, no    Does Patient Exercise:  yes    Packs/Day:  1.0    Passive Smoke Exposure:  yes    Caffeine use/day:  2    Hepatitis Risk:  no risk noted    HIV Risk:  no risk noted    STD  Risk:  no risk noted    Dental Care w/in 6 mos.:  no    Sun Exposure-Excessive:  no    Seat Belt Use:  yes    Sexual History:  currently monogamous    Blood Transfusions:  no  Review of Systems      See HPI General:  Denies chills, fatigue, fever, loss of appetite, malaise, sleep disorder, sweats, weakness, and weight loss. Eyes:  Denies blurring, discharge, double vision, eye irritation, eye pain, halos, itching, light sensitivity, red eye, vision loss-1 eye, and vision loss-both eyes. ENT:  Denies decreased hearing, difficulty swallowing, ear discharge, earache, hoarseness, nasal congestion, nosebleeds, postnasal drainage, ringing in ears, sinus pressure, and sore throat. CV:  Denies bluish discoloration of lips or nails, chest pain or discomfort, difficulty breathing at night, difficulty breathing while lying down, fainting, fatigue, leg cramps with exertion, lightheadness, near fainting, palpitations, shortness of breath with exertion, swelling of feet, swelling of hands, and weight gain. Resp:  Denies chest discomfort, chest pain with inspiration, cough, coughing up blood, excessive snoring, hypersomnolence, morning headaches, pleuritic, shortness of breath, sputum productive, and wheezing. GI:  Denies abdominal pain, bloody stools, change in bowel habits, constipation, dark tarry stools, diarrhea, excessive appetite, gas, hemorrhoids, indigestion, loss of appetite, nausea, vomiting, vomiting blood, and yellowish skin color. GU:  Denies abnormal vaginal bleeding, decreased libido, discharge, dysuria, genital sores, hematuria, incontinence, nocturia, urinary frequency, and urinary hesitancy. MS:  Denies joint pain, joint redness, joint swelling, loss of strength, low back pain, mid back pain, muscle aches, muscle , cramps, muscle weakness, stiffness, and thoracic pain. Derm:  Denies changes in color of skin, changes in nail beds, dryness, excessive perspiration, flushing, hair loss, insect  bite(s), itching, lesion(s), poor wound healing, and rash. Neuro:  Denies brief paralysis, difficulty with concentration, disturbances in coordination, falling down, headaches, inability to speak, memory loss, numbness, poor balance, seizures,  sensation of room spinning, tingling, tremors, visual disturbances, and weakness. Psych:  Denies alternate hallucination ( auditory/visual), anxiety, depression, easily angered, easily tearful, irritability, mental problems, panic attacks, sense of great danger, suicidal thoughts/plans, thoughts of violence, unusual visions or sounds, and thoughts /plans of harming others. Endo:  Denies cold intolerance, excessive hunger, excessive thirst, excessive urination, heat intolerance, polyuria, and weight change. Heme:  Denies abnormal bruising, bleeding, enlarge lymph nodes, fevers, pallor, and skin discoloration.  Physical Exam  General:  Well-developed,well-nourished,in no acute distress; alert,appropriate and cooperative throughout examination Head:  Normocephalic and atraumatic without obvious abnormalities. No apparent alopecia or balding. Eyes:  vision grossly intact, pupils equal, pupils round, pupils reactive to light, and no injection.   Ears:  External ear exam shows no significant lesions or deformities.  Otoscopic examination reveals clear canals, tympanic membranes are intact bilaterally without bulging, retraction, inflammation or discharge. Hearing is grossly normal bilaterally. Nose:  External nasal examination shows no deformity or inflammation. Nasal mucosa are pink and moist without lesions or exudates. Mouth:  Oral mucosa and oropharynx without lesions or exudates.  Teeth in good repair. Neck:  No deformities, masses, or tenderness noted. Chest Wall:  No deformities, masses, or tenderness noted. Breasts:  No mass, nodules, thickening, tenderness, bulging, retraction, inflamation, nipple discharge or skin changes noted.   Lungs:  Normal  respiratory effort, chest expands symmetrically. Lungs are clear to auscultation, no crackles or wheezes. Heart:  Normal rate and regular rhythm. S1 and S2 normal without gallop, murmur, click, rub or other extra sounds. Abdomen:  Bowel sounds positive,abdomen soft and non-tender without masses, organomegaly or hernias noted. Genitalia:  Pelvic Exam:        External: normal female genitalia without lesions or masses        Vagina: normal without lesions or masses        Cervix: normal without lesions or masses-- scant amout white d/c        Adnexa: normal bimanual exam without masses or fullness        Uterus: normal by palpation        Pap smear: performed Msk:  normal ROM, no joint tenderness, no joint swelling, no joint warmth, no redness over joints, no joint deformities, no joint instability, and no crepitation.   Pulses:  R posterior tibial normal, R dorsalis pedis normal, R carotid normal, L posterior tibial normal, L dorsalis pedis normal, and L carotid normal.   Extremities:  No clubbing, cyanosis, edema, or deformity noted with normal full range of motion of all joints.   Neurologic:  No cranial nerve deficits noted. Station and gait are normal. Plantar reflexes are down-going bilaterally. DTRs are symmetrical throughout. Sensory, motor and coordinative functions appear intact. Skin:  Intact without suspicious lesions or rashes Cervical Nodes:  No lymphadenopathy noted Axillary Nodes:  No palpable lymphadenopathy Psych:  Cognition and judgment appear intact. Alert and cooperative with normal attention span and concentration. No apparent delusions, illusions, hallucinations   Impression & Recommendations:  Problem # 1:  PREVENTIVE HEALTH CARE (ICD-V70.0) ghm utd school form filled out Orders: Venipuncture ([address]) TLB-Lipid Panel (80061-LIPID) TLB-BMP (Basic Metabolic Panel-BMET) (80048-METABOL) TLB-CBC Platelet - w/Differential (85025-CBCD) TLB-Hepatic/Liver Function Pnl  (80076-HEPATIC) TLB-TSH (Thyroid Stimulating Hormone) (84443-TSH) T-Measles (Rubeola) Antibody IgG (60454-09811) T-Mumps Virus Antibody, IgM (91478-29562) T-Rubella Virus Antibody, IgM (13086-57846) T- * Misc. Laboratory test [phone])  Complete Medication List: 1)  Yaz 3-0.02 Mg Tabs (Drospirenone-ethinyl estradiol) .... Take as directed   Immunization History:  Tetanus/Td Immunization History:  Tetanus/Td:  Tdap (02/20/2007)

## 2011-01-05 NOTE — Assessment & Plan Note (Signed)
Summary: ACUTE/SORE THROAT, FEVER,  STREP?//AIR//SPH  Medications Added AMOXICILLIN-POT CLAVULANATE 875-125 MG  TABS (AMOXICILLIN-POT CLAVULANATE) 1 q 12 hrs with food PROMETHAZINE-CODEINE 6.25-10 MG/5ML  SYRP (PROMETHAZINE-CODEINE) 1 tsp q 6 hrs as needed cough      Allergies Added: NKDA  Vital Signs:  Patient Profile:   30 Years Old Female Weight:      127 pounds Temp:     97.9 degrees F oral Pulse rate:   60 / minute Pulse rhythm:   regular Resp:     15 per minute BP sitting:   110 / 70  (left arm) Cuff size:   large  Pt. in pain?   no  Vitals Entered By: Wendall Stade (February 13, 2008 4:31 PM)                  Chief Complaint:  sick for one day and URI symptoms.  History of Present Illness: sore throat fever to 100.3 F body aches headache coughing non productive nasal mucus green family members diagnosed with strep  URI Symptoms; Rx: Tylenol Cold PM      This is a 30 year old woman who presents with URI symptoms.  Frontal ha & facial pain.  The patient reports nasal congestion, purulent nasal discharge, dry cough, and earache, but denies clear nasal discharge, sore throat, productive cough, and sick contacts.  Associated symptoms include fever, low-grade fever (<100.5 degrees), stiff neck, diarrhea, and use of an antipyretic.  The patient denies dyspnea, wheezing, rash, vomiting, and response to antipyretic.  The patient also reports headache, muscle aches, and severe fatigue.  The patient denies itchy watery eyes, itchy throat, sneezing, seasonal symptoms, and response to antihistamine.  Risk factors for Strep sinusitis include Strep exposure and tender adenopathy.  The patient denies the following risk factors for Strep sinusitis: double sickening and tooth pain.      Current Allergies (reviewed today): No known allergies       Physical Exam  General:     Well-developed,well-nourished,in no acute distress; alert,appropriate and cooperative throughout  examination;  mildly uncomfortable-appearing.   Eyes:     No corneal or conjunctival inflammation noted. EOMI. Perrla. Vision grossly normal. Ears:     External ear exam shows no significant lesions or deformities.  Otoscopic examination reveals clear canals, tympanic membranes are intact bilaterally without bulging, retraction, inflammation or discharge. Hearing is grossly normal bilaterally. Nose:     External nasal examination shows no deformity or inflammation. Nasal mucosa are erythematous. Septal dislocation. Mouth:     Oral mucosa and oropharynx without lesions or exudates.  Teeth in good repair. Mild erythema Lungs:     Normal respiratory effort, chest expands symmetrically. Lungs are clear to auscultation, no crackles or wheezes. Heart:     Normal rate and regular rhythm. S1 and S2 normal without gallop, murmur, click, rub or other extra sounds. Cervical Nodes:      Marked LA  Axillary Nodes:     No palpable lymphadenopathy    Impression & Recommendations:  Problem # 1:  SINUSITIS- ACUTE-NOS (ICD-461.9)  Her updated medication list for this problem includes:    Amoxicillin-pot Clavulanate 875-125 Mg Tabs (Amoxicillin-pot clavulanate) .Marland Kitchen... 1 q 12 hrs with food    Promethazine-codeine 6.25-10 Mg/73ml Syrp (Promethazine-codeine) .Marland Kitchen... 1 tsp q 6 hrs as needed cough   Problem # 2:  PHARYNGITIS-ACUTE (ICD-462)  Her updated medication list for this problem includes:    Amoxicillin-pot Clavulanate 875-125 Mg Tabs (Amoxicillin-pot clavulanate) .Marland Kitchen... 1 q 12  hrs with food   Complete Medication List: 1)  Yaz 3-0.02 Mg Tabs (Drospirenone-ethinyl estradiol) .... Take one tablet daily 2)  Amoxicillin-pot Clavulanate 875-125 Mg Tabs (Amoxicillin-pot clavulanate) .Marland Kitchen.. 1 q 12 hrs with food 3)  Promethazine-codeine 6.25-10 Mg/28ml Syrp (Promethazine-codeine) .Marland Kitchen.. 1 tsp q 6 hrs as needed cough  Other Orders: Rapid Strep ([address])   Patient Instructions: 1)  Drink as much fluid as you  can tolerate for the next few days. Plain Mucinex for thick secretions. Neti pot once daily as needed . 2)  Recommended remaining out of work for  02/14/08.    Prescriptions: PROMETHAZINE-CODEINE 6.25-10 MG/5ML  SYRP (PROMETHAZINE-CODEINE) 1 tsp q 6 hrs as needed cough  #120cc x 0   Entered and Authorized by:   Marga Melnick MD   Signed by:   Marga Melnick MD on 02/13/2008   Method used:   Print then Give to Patient   RxID:   [id number] AMOXICILLIN-POT CLAVULANATE 875-125 MG  TABS (AMOXICILLIN-POT CLAVULANATE) 1 q 12 hrs with food  #20 x 0   Entered and Authorized by:   Marga Melnick MD   Signed by:   Marga Melnick MD on 02/13/2008   Method used:   Print then Give to Patient   RxID:   [id number] PROMETHAZINE-CODEINE 6.25-10 MG/5ML  SYRP (PROMETHAZINE-CODEINE) 1 tsp q 6 hrs as needed cough  #120cc x 0   Entered and Authorized by:   Marga Melnick MD   Signed by:   Marga Melnick MD on 02/13/2008   Method used:   Electronically sent to ...       [facility] # [phone]*       [address]       [place], [place]  [address]       Ph: [phone]       Fax: [phone]   RxID:   [id number] AMOXICILLIN-POT CLAVULANATE 875-125 MG  TABS (AMOXICILLIN-POT CLAVULANATE) 1 q 12 hrs with food  #20 x 0   Entered and Authorized by:   Marga Melnick MD   Signed by:   Marga Melnick MD on 02/13/2008   Method used:   Electronically sent to ...       [facility] # [phone]*       [address]       [place], [place]  [address]       Ph: [phone]       Fax: [phone]   RxID:   [id number]  ] Laboratory Results  Date/Time Received: February 13, 2008 4:35 PM   Other Tests  Rapid Strep: negative    Appended Document: ACUTE/SORE THROAT, FEVER,  STREP?//AIR//SPH Warned to use protection while on meds.

## 2011-01-05 NOTE — Miscellaneous (Signed)
Summary: Vaccine Record/[facility]  Vaccine Record/[facility]   Imported By: Lanelle Bal 05/07/2009 08:30:20  _____________________________________________________________________  External Attachment:    Type:   Image     Comment:   External Document

## 2011-01-05 NOTE — Letter (Signed)
Summary: Primary Care Appointment Letter  [facility]  [address] [place], [place] [address]   Phone: [phone]  Fax: [phone]    12/03/2009 MRN: [id number]  [facility] [address] [place], [place]  [address]  Dear [name],   Your Primary Care Physician Loreen Freud DO has indicated that:    ____X___it is time to schedule an appointment FOR CPX W/PAP AND LABS BEFORE FURTHER REFILLS ON MEDS CAN BE GIVEN LAST OV 02/13/08.    _______you missed your appointment on______ and need to call and          reschedule.    _______you need to have lab work done.    _______you need to schedule an appointment discuss lab or test results.    _______you need to call to reschedule your appointment that is                       scheduled on _________.     Please call our office as soon as possible. Our phone number is [phone]_______.Our office is open 8a-5p, Monday through Friday.     Thank you,    [facility]

## 2011-01-08 NOTE — Letter (Signed)
Summary: [facility]  Medical [facility]   Imported By: Lanelle Bal 05/07/2009 08:28:19  _____________________________________________________________________  External Attachment:    Type:   Image     Comment:   External Document

## 2011-04-23 NOTE — H&P (Signed)
[facility]  Patient:    Heather Mclean, Heather Mclean                   MRN: [id number] Adm. Date:  [id number] Attending:  Cleatrice Burke                         History and Physical  HISTORY:                      The patient is a 30 year old, Gravida 0, who presents with history of abnormal Pap. The last Pap of November per her report was abnormal. The patient was told she has genital warts on her Pap smear.  She denied genital warts externally.  She is a tobacco user and she has had three to five partners in her life type.  PAST MEDICAL HISTORY:         She has no known drug allergies.  She takes no medications on a regular basis.  She denies medical illnesses.  GYN HISTORY:                  Menarche at age 46.  Her menses occur once per month and last five to seven days.  The patient does have cramping with her periods. he patient is using Ortho Tri-Cyclen and condoms for birth control.  SOCIAL HISTORY:               The patient smokes a pack per day of tobacco and as done so for three years.  She drinks alcohol once in approximately two months. She has a history of recreational drug use.  FAMILY HISTORY:               Noncontributory.  REVIEW OF SYSTEMS:            Noncontributory.  PHYSICAL EXAMINATION:  VITAL SIGNS:                  Blood pressure 100/60. Weight 110.  HEENT:                        Within normal limits.  LUNGS:                        Clear to auscultation bilaterally.  HEART:                        Regular rate and rhythm.  ABDOMEN:                      Soft and nontender with no hepatosplenomegaly.  BACK:                         Reveals no CVA tenderness.  EXTREMITIES:                  No clubbing, cyanosis or edema.  PELVIC:                       Normal external female genitalia. Vagina is without discharge. Cervix: There is an acetol like ring around the transformation zone. It is denser at approximately  10, 11 and 12:00 positions as well as 6:00. There is  mosaicism and punctation throughout.  The uterus is normal size, shape and consistency.  Adnexae are without masses.  RECTAL:                       Deferred.  LABORATORY DATA:              Pap from November 2000 shows low grade squamous endoepithelial lesion and cellular changes associated with HPV.  Pap from colposcopy January 2001 shows atypical squamous cells of undetermined significance. Colposcopic directed biopsies, endocervical curettings are negative. 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  PLAN:                         The patient has considered for a LEEP procedure. The risks and benefits are reviewed.  The patient will have the procedure performed on March 10, 2000. DD:  03/10/00 TD:  03/10/00 Job: [id number]

## 2011-04-23 NOTE — Op Note (Signed)
[facility]  Patient:    Heather Mclean, Heather Mclean                   MRN: [id number] Proc. Date: 03/10/00 Adm. Date:  [id number] Attending:  Cleatrice Burke                           Operative Report  PREOPERATIVE DIAGNOSIS:       CIN I and II.  POSTOPERATIVE DIAGNOSIS:      CIN I and II.  OPERATION:                    Loop electrocautery excision procedure.  SURGEON:                      Cecilio Asper, M.D.  ANESTHESIA:                   Local  ESTIMATED BLOOD LOSS:         Minimal  COMPLICATIONS:                None.  HISTORY:                      The patient is a 30 year old who has had a history of abnormal Pap smear.  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 The patient has therefore consented for a LEEP procedure.  DESCRIPTION OF PROCEDURE:     The patient was brought to the operating room and  placed in the dorsal lithotomy position.  LEEP speculum was placed inside of the vagina and the cervix and vagina were cleansed with Betadine.  The abnormal transformation zone was highlighted with Lugols solution, 10 cc of 1/2% Marcaine with epinephrine was injected into the cervix to achieve a paracervical block. A loop electrode was then used to remove the abnormal transformation zone as well as tissue anterior and posterior to this.  This was done with the assistance of the colposcope.  The base of the cervix was then made hemostatic with the ball cautery as well as Monsels solution.  The speculum was then removed.  The patient tolerated the procedure well. She was taken to recovery room in stable condition. DD:  03/10/00 TD:  03/10/00 Job: [id number]

## 2012-12-01 ENCOUNTER — Ambulatory Visit (INDEPENDENT_AMBULATORY_CARE_PROVIDER_SITE_OTHER): Payer: BC Managed Care – PPO | Admitting: Family Medicine

## 2012-12-01 VITALS — BP 118/87 | HR 84 | Temp 98.4°F | Resp 16 | Ht 65.0 in | Wt 151.2 lb

## 2012-12-01 DIAGNOSIS — H60399 Other infective otitis externa, unspecified ear: Secondary | ICD-10-CM

## 2012-12-01 DIAGNOSIS — H6010 Cellulitis of external ear, unspecified ear: Secondary | ICD-10-CM

## 2012-12-01 DIAGNOSIS — R21 Rash and other nonspecific skin eruption: Secondary | ICD-10-CM

## 2012-12-01 MED ORDER — DOXYCYCLINE HYCLATE 100 MG PO TABS: 100.0000 mg | ORAL_TABLET | Freq: Two times a day (BID) | ORAL | Status: DC

## 2012-12-01 MED ORDER — METRONIDAZOLE 1 % EX GEL: Freq: Every day | CUTANEOUS | Status: DC

## 2012-12-01 NOTE — Patient Instructions (Addendum)
Use the oral antibiotic as directed, and the metrogel once a day. Keep your skin hydrated- avoid long hot showers and pat dry gently and use a gentle moisturizing cream. Try Boots expert sensitive skin moisturizer available at [employer].   If you are not better in the next few days please let us know.

## 2012-12-01 NOTE — Progress Notes (Signed)
[facility] [address] [place] [address] [phone]  Date:  12/01/2012   Name:  Heather Mclean   DOB:  1981/07/21   MRN:  [id number]  PCP:  Loreen Freud, DO    Chief Complaint: Rash   History of Present Illness:  Heather Mclean is a 31 y.o. very pleasant female patient who presents with the following:  On 12/25 she finished her morning shower and felt an irritation on her right ear.  She tried benadryl but it did not help.  She has itching over her right ear, and it feels warm.  The earlobe is red and irritated.  Her cheeks are also sensitive and inflamed- right is much worse, but her left also has some irritation.  She does not have a history of rosacea.   She had a small patch of a similar rash on her right arm which has cleared, and also a small area on her neck.  Otherwise she feels well, no fever.  She is generally healthy.   No known exposure to any new creams or products, no new ear piercing.  Otherwise she feels well, no fever, ST, or hearing loss/ vision change.    She had shingles in the 2nd grade.    LMP was 11/05/12 Patient Active Problem List  Diagnosis  . TOBACCO USER  . SINUSITIS- ACUTE-NOS  . SORE THROAT  . AMENORRHEA    No past medical history on file.  No past surgical history on file.  History  Substance Use Topics  . Smoking status: Current Every Day Smoker  . Smokeless tobacco: Not on file  . Alcohol Use: Not on file    No family history on file.  No Known Allergies  Medication list has been reviewed and updated.  No current outpatient prescriptions on file prior to visit.    Review of Systems:  As per HPI- otherwise negative.   Physical Examination: Filed Vitals:   12/01/12 1043  BP: 118/87  Pulse: 84  Temp: 98.4 F (36.9 C)  Resp: 16   Filed Vitals:   12/01/12 1043  Height: 5\' 5"  (1.651 m)  Weight: 151 lb 3.2 oz (68.584 kg)   Body mass index is 25.16 kg/(m^2). Ideal Body Weight:  Weight in (lb) to have BMI = 25: 149.9   GEN: WDWN, NAD, Non-toxic, A & O x 3 HEENT: Atraumatic, Normocephalic. Neck supple. No masses, No LAD. Bilateral TM wnl, oropharynx normal.  PEERL,EOMI.   Right ear lobe is slightly dry and crusty, but there are no vesicles- I do not think this is shingles.  She also has ruddiness and irritation over both cheeks, right more than left.  Her conjunctivae are normal and there are no lesions on her eyelids.   Ears and Nose: No external deformity. CV: RRR, No M/G/R. No JVD. No thrill. No extra heart sounds. PULM: CTA B, no wheezes, crackles, rhonchi. No retractions. No resp. distress. No accessory muscle use. EXTR: No c/c/e NEURO Normal gait.  PSYCH: Normally interactive. Conversant. Not depressed or anxious appearing.  Calm demeanor.    Assessment and Plan: 1. Cellulitis of earlobe  doxycycline (VIBRA-TABS) 100 MG tablet  2. Rash of face  metroNIDAZOLE (METROGEL) 1 % gel   Suspect a bacterial cause of earlobe and facial irritation.  Treat with doxy and metrogel topically.  Let me know if not better in the next 36- 48 hours.  If any lesions or eye symptoms please let me know right  away  [facility], MD

## 2013-05-07 ENCOUNTER — Encounter (HOSPITAL_COMMUNITY): Payer: Self-pay | Admitting: *Deleted

## 2013-05-07 ENCOUNTER — Emergency Department (HOSPITAL_COMMUNITY)
Admission: EM | Admit: 2013-05-07 | Discharge: 2013-05-07 | Disposition: A | Discharge status: Home / Self Care | Payer: BC Managed Care – PPO | Attending: Emergency Medicine | Admitting: Emergency Medicine

## 2013-05-07 DIAGNOSIS — R209 Unspecified disturbances of skin sensation: Secondary | ICD-10-CM | POA: Insufficient documentation

## 2013-05-07 DIAGNOSIS — R61 Generalized hyperhidrosis: Secondary | ICD-10-CM | POA: Insufficient documentation

## 2013-05-07 DIAGNOSIS — R51 Headache: Secondary | ICD-10-CM | POA: Insufficient documentation

## 2013-05-07 DIAGNOSIS — F17200 Nicotine dependence, unspecified, uncomplicated: Secondary | ICD-10-CM | POA: Insufficient documentation

## 2013-05-07 DIAGNOSIS — H538 Other visual disturbances: Secondary | ICD-10-CM | POA: Insufficient documentation

## 2013-05-07 DIAGNOSIS — R002 Palpitations: Secondary | ICD-10-CM | POA: Insufficient documentation

## 2013-05-07 DIAGNOSIS — R519 Headache, unspecified: Secondary | ICD-10-CM

## 2013-05-07 LAB — CBC
HCT: 45.4 % (ref 36.0–46.0)
Hemoglobin: 15.4 g/dL — ABNORMAL HIGH (ref 12.0–15.0)
MCH: 31.2 pg (ref 26.0–34.0)
MCHC: 33.9 g/dL (ref 30.0–36.0)
MCV: 92.1 fL (ref 78.0–100.0)
Platelets: 266 10*3/uL (ref 150–400)
RBC: 4.93 MIL/uL (ref 3.87–5.11)
RDW: 13.4 % (ref 11.5–15.5)
WBC: 6.4 10*3/uL (ref 4.0–10.5)

## 2013-05-07 LAB — BASIC METABOLIC PANEL
BUN: 12 mg/dL (ref 6–23)
CO2: 26 mEq/L (ref 19–32)
Calcium: 9.3 mg/dL (ref 8.4–10.5)
Chloride: 100 mEq/L (ref 96–112)
Creatinine, Ser: 0.71 mg/dL (ref 0.50–1.10)
GFR calc Af Amer: >90 mL/min (ref >90)
GFR calc non Af Amer: >90 mL/min (ref >90)
Glucose, Bld: 107 mg/dL — ABNORMAL HIGH (ref 70–99)
Potassium: 4 mEq/L (ref 3.5–5.1)
Sodium: 138 mEq/L (ref 135–145)

## 2013-05-07 LAB — GLUCOSE, CAPILLARY: Glucose-Capillary: 119 mg/dL — ABNORMAL HIGH (ref 70–99)

## 2013-05-07 NOTE — ED Notes (Signed)
Pt reports that right before this episode happened she was talking to her friend at work about something that had upset her and thinks it could of caused her to have an anxiety attack.

## 2013-05-07 NOTE — ED Notes (Signed)
Pt reports palpitations for last couple of months.  Pt reports upper back, neck pain with chills and sweats.  This am started having chills blurred vision and left arm felt like it was asleep.  Pt states she still feels sweaty and both arms feel tingley.  Blurred vision went away.  Pt states no chest pain, just upper back pain.  Pt states dizziness and headache happened first, then could not concentrate, and then became sweaty and tingling in arms.

## 2013-05-07 NOTE — ED Notes (Signed)
CBG was 119. Notified Nurse Martie Lee.

## 2013-05-07 NOTE — ED Provider Notes (Signed)
History     CSN: [id number]  Arrival date & time 05/07/13  1113   First MD Initiated Contact with Patient 05/07/13 1142      Chief Complaint  Patient presents with  . Dizziness  . Headache  . Palpitations    The history is provided by the patient.  dizziness Onset - earlier this morning Duration - 1 hr Course - improving and it is resolved Worsened by - nothing Improved by - nothing   Pt presents for an episode this morning that consistent of feeling dizzy/lightheaded, mild headache and bilateral arm numbness.  No focal weakness. She reports blurred vision but no visual loss and it has resolved No syncope HA is improved and the headache this morning was similar to prior HA and not worse of life No falls No fever No cp/sob She reports diaphoresis this morning but this improved  She reports having palpitations for "months" and can occur at random.  No syncope No current medication use  Also reports daily headaches and reports she has not had this evaluated    PMH - none Soc hx - smoker, frequent caffeine use  History reviewed. No pertinent past surgical history.  No family history on file.  History  Substance Use Topics  . Smoking status: Current Every Day Smoker  . Smokeless tobacco: Not on file  . Alcohol Use: No    OB History   Grav Para Term Preterm Abortions TAB SAB Ect Mult Living                  Review of Systems  Constitutional: Positive for diaphoresis. Negative for fever.  HENT: Negative for neck stiffness.   Eyes: Negative for photophobia.  Cardiovascular: Negative for chest pain.  Gastrointestinal: Negative for vomiting.  Musculoskeletal: Negative for back pain.  Skin: Negative for rash.       Denies recent tick bites   Neurological: Positive for dizziness and headaches. Negative for syncope and speech difficulty.  Psychiatric/Behavioral: Negative for agitation.  All other systems reviewed and are negative.    Allergies  Review of  patient's allergies indicates no known allergies.  Home Medications   Current Outpatient Rx  Name  Route  Sig  Dispense  Refill  . acetaminophen (TYLENOL) 325 MG tablet   Oral   Take 650 mg by mouth every 6 (six) hours as needed for pain.           BP 137/91  Pulse 108  Temp(Src) 99.1 F (37.3 C) (Oral)  Resp 18  SpO2 98%  LMP 04/29/2013  Physical Exam CONSTITUTIONAL: Well developed/well nourished HEAD: Normocephalic/atraumatic EYES: EOMI/PERRL, no nystagmus, normal fundoscopic exam  ENMT: Mucous membranes moist NECK: supple no meningeal signs, no bruits SPINE:entire spine nontender CV: S1/S2 noted, no murmurs/rubs/gallops noted LUNGS: Lungs are clear to auscultation bilaterally, no apparent distress ABDOMEN: soft, nontender, no rebound or guarding GU:no cva tenderness NEURO:Awake/alert, facies symmetric, no arm or leg drift is noted Cranial nerves 3/4/5/6/06/13/09/11/12 tested and intact Gait normal without ataxia No past pointing No speech disturbance noted EXTREMITIES: pulses normal, full ROM SKIN: warm, color normal, no rash PSYCH: no abnormalities of mood noted   ED Course  Procedures   Labs Reviewed  CBC - Abnormal; Notable for the following:    Hemoglobin 15.4 (*)    All other components within normal limits  GLUCOSE, CAPILLARY - Abnormal; Notable for the following:    Glucose-Capillary 119 (*)    All other components within normal limits  BASIC  METABOLIC PANEL   Pt well appearing, no neuro deficits, I doubt CVA/SAH as cause of symptoms I doubt other acute neurologic process I offered CT head due to persistent headaches recently but she would like to f/u as outpatient She does not want to wait for any further testing including urine pregnancy Outpatient resources given    MDM  Nursing notes including past medical history and social history reviewed and considered in documentation Labs/vital reviewed and considered        Date: 05/07/2013   Rate: 96  Rhythm: normal sinus rhythm  QRS Axis: normal  Intervals: normal  ST/T Wave abnormalities: nonspecific ST changes  Conduction Disutrbances:none No delta waves noted   Joya Gaskins, MD 05/07/13 1324

## 2013-05-07 NOTE — ED Notes (Signed)
Pt reports today while she was sitting at work she became very lightheaded and started to experience numbness/tingling in bilateral upper extremities, diaphoretic along with blurred vision. Pt reports  she got a HA before all of then happened, afterwards she just felt wiped out, her co-worker noticed and said she looked pale. Pt sts over the past couple of months she has been having palpitations almost every day, lasting approx 1 minute, usually gets them in the middle of the day. Pt denies palpitations along with this episode. Pt denies CP/SOB. Pt in nad, skin warm and dry, resp e/u.

## 2013-05-07 NOTE — ED Notes (Signed)
Mini lab tech couldn't find urine that was placed in POCT box. Called Main lab, unable to locate urine. Pt informed the need for a new sample. Pt refusing to give new sample, sts that she knows she isn't pregnant and doesn't want a urine test.

## 2013-10-10 ENCOUNTER — Encounter: Payer: BC Managed Care – PPO | Admitting: Gynecology

## 2013-10-22 ENCOUNTER — Encounter: Payer: Self-pay | Admitting: Interventional Cardiology

## 2013-10-22 ENCOUNTER — Encounter: Payer: Self-pay | Admitting: *Deleted

## 2013-10-23 ENCOUNTER — Encounter: Payer: Self-pay | Admitting: Interventional Cardiology

## 2013-10-23 ENCOUNTER — Encounter (INDEPENDENT_AMBULATORY_CARE_PROVIDER_SITE_OTHER): Payer: Self-pay

## 2013-10-23 ENCOUNTER — Ambulatory Visit (INDEPENDENT_AMBULATORY_CARE_PROVIDER_SITE_OTHER): Payer: BC Managed Care – PPO | Admitting: Interventional Cardiology

## 2013-10-23 VITALS — BP 128/82 | HR 72 | Ht 65.0 in | Wt 138.0 lb

## 2013-10-23 DIAGNOSIS — R002 Palpitations: Secondary | ICD-10-CM

## 2013-10-23 DIAGNOSIS — F17200 Nicotine dependence, unspecified, uncomplicated: Secondary | ICD-10-CM

## 2013-10-23 DIAGNOSIS — R0602 Shortness of breath: Secondary | ICD-10-CM

## 2013-10-23 NOTE — Patient Instructions (Signed)
Your physician has requested that you have an echocardiogram. Echocardiography is a painless test that uses sound waves to create images of your heart. It provides your doctor with information about the size and shape of your heart and how well your heart's chambers and valves are working. This procedure takes approximately one hour. There are no restrictions for this procedure.  Your physician has recommended that you wear an event monitor. Event monitors are medical devices that record the heart's electrical activity. Doctors most often us these monitors to diagnose arrhythmias. Arrhythmias are problems with the speed or rhythm of the heartbeat. The monitor is a small, portable device. You can wear one while you do your normal daily activities. This is usually used to diagnose what is causing palpitations/syncope (passing out).   

## 2013-10-23 NOTE — Progress Notes (Signed)
Patient ID: Heather Mclean, female   DOB: 12-04-1981, 32 y.o.   MRN: [id number]     Patient ID: Heather Mclean MRN: [id number] DOB/AGE: February 04, 1981 32 y.o.   Referring Physician [employer] [facility]   Reason for Consultation palpitations  HPI: 32 y/o who has had intermittent palpitations since Dec 2013.  They can vary from daily for a month to 2-3x/month.  Episodes usually last a minute- 1 hour.  Recently, she used a phone app to check her HR during an episode and her HR was in the 130 range.  No syncope and mild dizziness with the episodes.  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  No problems with this or with stairs.    Chronic cough.  She had a trip to the ER in June.  She had normal ECG.  She has some SHOB , wirse in the morning with wheezing.,  She has some fatigue.  She gets [facility] with house cleaning as well.     No current outpatient prescriptions on file.   No current facility-administered medications for this visit.   Past Medical History  Diagnosis Date  . TOBACCO USER   . SINUSITIS- ACUTE-NOS   . SORE THROAT   . AMENORRHEA     No family history on file.  History   Social History  . Marital Status: Single    Spouse Name: N/A    Number of Children: N/A  . Years of Education: N/A   Occupational History  . Not on file.   Social History Main Topics  . Smoking status: Current Every Day Smoker  . Smokeless tobacco: Not on file  . Alcohol Use: No  . Drug Use: Yes    Special: Marijuana  . Sexual Activity: Not on file   Other Topics Concern  . Not on file   Social History Narrative  . No narrative on file    No past surgical history on file.    (Not in a hospital admission)  Review of systems complete and found to be negative unless listed above .  No nausea, vomiting.  No fever chills, No focal weakness,  No palpitations.  Physical Exam: Filed Vitals:   10/23/13 1440  BP: 128/82  Pulse: 72    Weight: 138 lb (62.596 kg)  Physical exam:    [place]/AT EOMI No JVD, No carotid bruit RRR S1S2  No wheezing Soft. NT, nondistended No edema. No focal motor or sensory deficits Normal affect  Labs:   Lab Results  Component Value Date   WBC 6.4 05/07/2013   HGB 15.4* 05/07/2013   HCT 45.4 05/07/2013   MCV 92.1 05/07/2013   PLT 266 05/07/2013   No results found for this basename: NA, K, CL, CO2, BUN, CREATININE, CALCIUM, LABALBU, PROT, BILITOT, ALKPHOS, ALT, AST, GLUCOSE,  in the last 168 hours No results found for this basename: CKTOTAL, CKMB, CKMBINDEX, TROPONINI    Lab Results  Component Value Date   CHOL 149 01/26/2010   CHOL 163 04/11/2009   CHOL 146 02/20/2007   Lab Results  Component Value Date   HDL 72.70 01/26/2010   HDL 78.29 04/11/2009   HDL 80.0 02/20/2007   Lab Results  Component Value Date   LDLCALC 52 01/26/2010   LDLCALC 61 04/11/2009   LDLCALC 49 02/20/2007   Lab Results  Component Value Date   TRIG 120.0 01/26/2010   TRIG 132.0 04/11/2009   TRIG 83 02/20/2007   Lab Results  Component Value Date   CHOLHDL  2 01/26/2010   CHOLHDL 2 04/11/2009   CHOLHDL 1.8 CALC 02/20/2007   No results found for this basename: LDLDIRECT       WUJ:[id number]  ASSESSMENT AND PLAN:  Palpitations: Plan for event monitor. Doubt life-threatening arrhythmia. She may have some type of SVT.  Shortness of breath: Given that this is getting more frequent, plan for echocardiogram to evaluate for structural heart disease.  tobacco abuse: We spoke about smoking cessation. She is not interested in quitting cigarettes at this time. She has tried Chantix in the past but had side effects. She feels that cigarettes help her Lasix. I encouraged her to talk to her primary care doctor about finding some other anxiolytic other than cigarettes. Signed:   Fredric Mare, MD, [facility] 10/23/2013, 3:12 PM

## 2013-11-07 ENCOUNTER — Encounter: Payer: Self-pay | Admitting: *Deleted

## 2013-11-07 ENCOUNTER — Ambulatory Visit (HOSPITAL_COMMUNITY): Payer: BC Managed Care – PPO | Attending: Cardiology | Admitting: Radiology

## 2013-11-07 ENCOUNTER — Encounter: Payer: Self-pay | Admitting: Cardiology

## 2013-11-07 DIAGNOSIS — R0602 Shortness of breath: Secondary | ICD-10-CM

## 2013-11-07 DIAGNOSIS — R0989 Other specified symptoms and signs involving the circulatory and respiratory systems: Secondary | ICD-10-CM | POA: Insufficient documentation

## 2013-11-07 DIAGNOSIS — R062 Wheezing: Secondary | ICD-10-CM | POA: Insufficient documentation

## 2013-11-07 DIAGNOSIS — R05 Cough: Secondary | ICD-10-CM | POA: Insufficient documentation

## 2013-11-07 DIAGNOSIS — R002 Palpitations: Secondary | ICD-10-CM

## 2013-11-07 DIAGNOSIS — R0609 Other forms of dyspnea: Secondary | ICD-10-CM | POA: Insufficient documentation

## 2013-11-07 DIAGNOSIS — F17200 Nicotine dependence, unspecified, uncomplicated: Secondary | ICD-10-CM | POA: Insufficient documentation

## 2013-11-07 DIAGNOSIS — R059 Cough, unspecified: Secondary | ICD-10-CM | POA: Insufficient documentation

## 2013-11-07 NOTE — Progress Notes (Signed)
Patient ID: Heather Mclean, female   DOB: [birth year], 32 y.o.   MRN: [id number] [employer] 30 day cardiac event monitor applied to patient.

## 2013-11-07 NOTE — Progress Notes (Signed)
Patient ID: MIEKO KNEEBONE, female   DOB: Jun 20, 1981, 32 y.o.   MRN: [id number] Patient decided to hold off on cardiac event monitor due to concern regarding deductible costs.  She wants to wait until she receives her Echo results, speaks with the Dr., and determines this is absolutely necessary prior to proceeding with a cardiac event monitor.  A new order will need to be placed at that time.

## 2013-11-07 NOTE — Progress Notes (Signed)
Echocardiogram performed.  

## 2013-11-09 ENCOUNTER — Encounter: Payer: BC Managed Care – PPO | Admitting: Gynecology

## 2013-11-16 ENCOUNTER — Ambulatory Visit (INDEPENDENT_AMBULATORY_CARE_PROVIDER_SITE_OTHER): Payer: BC Managed Care – PPO | Admitting: Gynecology

## 2013-11-16 ENCOUNTER — Encounter: Payer: Self-pay | Admitting: Gynecology

## 2013-11-16 VITALS — BP 114/60 | HR 62 | Resp 14 | Ht 66.0 in | Wt 139.0 lb

## 2013-11-16 DIAGNOSIS — N898 Other specified noninflammatory disorders of vagina: Secondary | ICD-10-CM

## 2013-11-16 DIAGNOSIS — Z01419 Encounter for gynecological examination (general) (routine) without abnormal findings: Secondary | ICD-10-CM

## 2013-11-16 DIAGNOSIS — Z3009 Encounter for other general counseling and advice on contraception: Secondary | ICD-10-CM

## 2013-11-16 DIAGNOSIS — Z0000 Encounter for general adult medical examination without abnormal findings: Secondary | ICD-10-CM

## 2013-11-16 DIAGNOSIS — Z113 Encounter for screening for infections with a predominantly sexual mode of transmission: Secondary | ICD-10-CM

## 2013-11-16 DIAGNOSIS — Z124 Encounter for screening for malignant neoplasm of cervix: Secondary | ICD-10-CM

## 2013-11-16 LAB — POCT URINALYSIS DIPSTICK
Leukocytes, UA: NEGATIVE
Urobilinogen, UA: NEGATIVE
pH, UA: 7

## 2013-11-16 LAB — POCT WET PREP (WET MOUNT): Clue Cells Wet Prep Whiff POC: NEGATIVE

## 2013-11-16 LAB — HEMOGLOBIN, FINGERSTICK: Hemoglobin, fingerstick: 14.6 g/dL (ref 12.0–16.0)

## 2013-11-16 NOTE — Progress Notes (Signed)
32 y.o. Single Caucasian female   G2P0020 here for annual exam. Pt is currently sexually active. Pt reports vaginal discharge with itch for the last few months, comes and goes without treatment, not related to cycle but notices white vaginal discharge with straining.  Current partner 52m, no condoms, no CTM, GC. Pt had a history of amenorrhea and DUB on ocp so she stopped using.  Pt would ok if pregnant.  Patient's last menstrual period was 10/22/2013.          Sexually active: yes  The current method of family planning is none and partner uses pull out method.    Exercising: no  The patient does not participate in regular exercise at present. Last pap: within past 2 years Normal Alcohol: yes 21 drinks/wk Tobacco: yes 1 pack/qd BSE: no  Hgb: 14.6    ; Urine: Negative  Health Maintenance  Topic Date Due  . Pap Smear  01/26/2013  . Influenza Vaccine  07/06/2013  . Tetanus/tdap  02/19/2017    No family history on file.  Patient Active Problem List   Diagnosis Date Noted  . TOBACCO USER 01/26/2010  . SINUSITIS- ACUTE-NOS 02/13/2008  . SORE THROAT 02/13/2008  . AMENORRHEA 12/14/2007    Past Medical History  Diagnosis Date  . TOBACCO USER   . SINUSITIS- ACUTE-NOS   . SORE THROAT   . AMENORRHEA   . Anxiety   . Depression   . HPV (human papilloma virus) anogenital infection Age 30    Past Surgical History  Procedure Laterality Date  . Colposcopy  Age 56  . Wisdom tooth extraction  Age 22    Allergies: Review of patient's allergies indicates no known allergies.  No current outpatient prescriptions on file.   No current facility-administered medications for this visit.    ROS: Pertinent items are noted in HPI.  Exam:    BP 114/60  Pulse 62  Resp 14  Ht 5\' 6"  (1.676 m)  Wt 139 lb (63.05 kg)  BMI 22.45 kg/m2  LMP 10/22/2013 Weight change: @WEIGHTCHANGE @ Last 3 height recordings:  Ht Readings from Last 3 Encounters:  11/16/13 5\' 6"  (1.676 m)  10/23/13 5\' 5"   (1.651 m)  12/01/12 5\' 5"  (1.651 m)   General appearance: alert, cooperative and appears stated age Head: Normocephalic, without obvious abnormality, atraumatic Neck: no adenopathy, no carotid bruit, no JVD, supple, symmetrical, trachea midline and thyroid not enlarged, symmetric, no tenderness/mass/nodules Lungs: clear to auscultation bilaterally Breasts: normal appearance, no masses or tenderness Heart: regular rate and rhythm, S1, S2 normal, no murmur, click, rub or gallop Abdomen: soft, non-tender; bowel sounds normal; no masses,  no organomegaly Extremities: extremities normal, atraumatic, no cyanosis or edema Skin: Skin color, texture, turgor normal. No rashes or lesions Lymph nodes: Cervical, supraclavicular, and axillary nodes normal. no inguinal nodes palpated Neurologic: Grossly normal   Pelvic: External genitalia:  no lesions              Urethra: normal appearing urethra with no masses, tenderness or lesions              Bartholins and Skenes: normal                 Vagina: normal appearing vagina with normal color and discharge, no lesions              Cervix: scarred c/w prior LEEP?              Pap taken: yes  Bimanual Exam:  Uterus:  uterus is normal size, shape, consistency and nontender                                      Adnexa:    normal adnexa in size, nontender and no masses                                      Rectovaginal: Confirms                                      Anus:  normal sphincter tone, no lesions  A: well woman Contraceptive management Vaginal discharge    P:  pap smear with HRHPV done today, prior LEEP? Will try to get records Recommend quitting tobacco Discussed contraceptive options, informed that amenorrhea on ocp can be normal, alternative discussed, risks of ocp and tobacco reviewed. Vag d/c normal pt assured counseled on breast self exam, STD prevention, family planning choices, adequate intake of calcium and vitamin D, diet and  exercise return annually or prn   An After Visit Summary was printed and given to the patient.

## 2013-11-16 NOTE — Patient Instructions (Addendum)
Etonogestrel implant What is this medicine? ETONOGESTREL (et oh noe JES trel) is a contraceptive (birth control) device. It is used to prevent pregnancy. It can be used for up to 3 years. This medicine may be used for other purposes; ask your health care provider or pharmacist if you have questions. COMMON BRAND NAME(S): Implanon, Nexplanon  What should I tell my health care provider before I take this medicine? They need to know if you have any of these conditions: -abnormal vaginal bleeding -blood vessel disease or blood clots -cancer of the breast, cervix, or liver -depression -diabetes -gallbladder disease -headaches -heart disease or recent heart attack -high blood pressure -high cholesterol -kidney disease -liver disease -renal disease -seizures -tobacco smoker -an unusual or allergic reaction to etonogestrel, other hormones, anesthetics or antiseptics, medicines, foods, dyes, or preservatives -pregnant or trying to get pregnant -breast-feeding How should I use this medicine? This device is inserted just under the skin on the inner side of your upper arm by a health care professional. Talk to your pediatrician regarding the use of this medicine in children. Special care may be needed. Overdosage: If you think you've taken too much of this medicine contact a poison control center or emergency room at once. Overdosage: If you think you have taken too much of this medicine contact a poison control center or emergency room at once. NOTE: This medicine is only for you. Do not share this medicine with others. What if I miss a dose? This does not apply. What may interact with this medicine? Do not take this medicine with any of the following medications: -amprenavir -bosentan -fosamprenavir This medicine may also interact with the following medications: -barbiturate medicines for inducing sleep or treating seizures -certain medicines for fungal infections like ketoconazole and  itraconazole -griseofulvin -medicines to treat seizures like carbamazepine, felbamate, oxcarbazepine, phenytoin, topiramate -modafinil -phenylbutazone -rifampin -some medicines to treat HIV infection like atazanavir, indinavir, lopinavir, nelfinavir, tipranavir, ritonavir -St. John's wort This list may not describe all possible interactions. Give your health care provider a list of all the medicines, herbs, non-prescription drugs, or dietary supplements you use. Also tell them if you smoke, drink alcohol, or use illegal drugs. Some items may interact with your medicine. What should I watch for while using this medicine? This product does not protect you against HIV infection (AIDS) or other sexually transmitted diseases. You should be able to feel the implant by pressing your fingertips over the skin where it was inserted. Tell your doctor if you cannot feel the implant. What side effects may I notice from receiving this medicine? Side effects that you should report to your doctor or health care professional as soon as possible: -allergic reactions like skin rash, itching or hives, swelling of the face, lips, or tongue -breast lumps -changes in vision -confusion, trouble speaking or understanding -dark urine -depressed mood -general ill feeling or flu-like symptoms -light-colored stools -loss of appetite, nausea -right upper belly pain -severe headaches -severe pain, swelling, or tenderness in the abdomen -shortness of breath, chest pain, swelling in a leg -signs of pregnancy -sudden numbness or weakness of the face, arm or leg -trouble walking, dizziness, loss of balance or coordination -unusual vaginal bleeding, discharge -unusually weak or tired -yellowing of the eyes or skin Side effects that usually do not require medical attention (Report these to your doctor or health care professional if they continue or are bothersome.): -acne -breast pain -changes in  weight -cough -fever or chills -headache -irregular menstrual bleeding -itching, burning,   and vaginal discharge -pain or difficulty passing urine -sore throat This list may not describe all possible side effects. Call your doctor for medical advice about side effects. You may report side effects to FDA at 1-800-FDA-1088. Where should I keep my medicine? This drug is given in a hospital or clinic and will not be stored at home. NOTE: This sheet is a summary. It may not cover all possible information. If you have questions about this medicine, talk to your doctor, pharmacist, or health care provider.  2014, Elsevier/Gold Standard. (2012-05-29 15:37:45)   Levonorgestrel intrauterine device (IUD) What is this medicine? LEVONORGESTREL IUD (LEE voe nor jes trel) is a contraceptive (birth control) device. The device is placed inside the uterus by a healthcare professional. It is used to prevent pregnancy and can also be used to treat heavy bleeding that occurs during your period. Depending on the device, it can be used for 3 to 5 years. This medicine may be used for other purposes; ask your health care provider or pharmacist if you have questions. COMMON BRAND NAME(S): Gretta Cool What should I tell my health care provider before I take this medicine? They need to know if you have any of these conditions: -abnormal Pap smear -cancer of the breast, uterus, or cervix -diabetes -endometritis -genital or pelvic infection now or in the past -have more than one sexual partner or your partner has more than one partner -heart disease -history of an ectopic or tubal pregnancy -immune system problems -IUD in place -liver disease or tumor -problems with blood clots or take blood-thinners -use intravenous drugs -uterus of unusual shape -vaginal bleeding that has not been explained -an unusual or allergic reaction to levonorgestrel, other hormones, silicone, or polyethylene, medicines, foods,  dyes, or preservatives -pregnant or trying to get pregnant -breast-feeding How should I use this medicine? This device is placed inside the uterus by a health care professional. Talk to your pediatrician regarding the use of this medicine in children. Special care may be needed. Overdosage: If you think you have taken too much of this medicine contact a poison control center or emergency room at once. NOTE: This medicine is only for you. Do not share this medicine with others. What if I miss a dose? This does not apply. What may interact with this medicine? Do not take this medicine with any of the following medications: -amprenavir -bosentan -fosamprenavir This medicine may also interact with the following medications: -aprepitant -barbiturate medicines for inducing sleep or treating seizures -bexarotene -griseofulvin -medicines to treat seizures like carbamazepine, ethotoin, felbamate, oxcarbazepine, phenytoin, topiramate -modafinil -pioglitazone -rifabutin -rifampin -rifapentine -some medicines to treat HIV infection like atazanavir, indinavir, lopinavir, nelfinavir, tipranavir, ritonavir -St. John's wort -warfarin This list may not describe all possible interactions. Give your health care provider a list of all the medicines, herbs, non-prescription drugs, or dietary supplements you use. Also tell them if you smoke, drink alcohol, or use illegal drugs. Some items may interact with your medicine. What should I watch for while using this medicine? Visit your doctor or health care professional for regular check ups. See your doctor if you or your partner has sexual contact with others, becomes HIV positive, or gets a sexual transmitted disease. This product does not protect you against HIV infection (AIDS) or other sexually transmitted diseases. You can check the placement of the IUD yourself by reaching up to the top of your vagina with clean fingers to feel the threads. Do not  pull on the threads. It is  a good habit to check placement after each menstrual period. Call your doctor right away if you feel more of the IUD than just the threads or if you cannot feel the threads at all. The IUD may come out by itself. You may become pregnant if the device comes out. If you notice that the IUD has come out use a backup birth control method like condoms and call your health care provider. Using tampons will not change the position of the IUD and are okay to use during your period. What side effects may I notice from receiving this medicine? Side effects that you should report to your doctor or health care professional as soon as possible: -allergic reactions like skin rash, itching or hives, swelling of the face, lips, or tongue -fever, flu-like symptoms -genital sores -high blood pressure -no menstrual period for 6 weeks during use -pain, swelling, warmth in the leg -pelvic pain or tenderness -severe or sudden headache -signs of pregnancy -stomach cramping -sudden shortness of breath -trouble with balance, talking, or walking -unusual vaginal bleeding, discharge -yellowing of the eyes or skin Side effects that usually do not require medical attention (report to your doctor or health care professional if they continue or are bothersome): -acne -breast pain -change in sex drive or performance -changes in weight -cramping, dizziness, or faintness while the device is being inserted -headache -irregular menstrual bleeding within first 3 to 6 months of use -nausea This list may not describe all possible side effects. Call your doctor for medical advice about side effects. You may report side effects to FDA at 1-800-FDA-1088. Where should I keep my medicine? This does not apply. NOTE: This sheet is a summary. It may not cover all possible information. If you have questions about this medicine, talk to your doctor, pharmacist, or health care provider.  2014,  Elsevier/Gold Standard. (2011-12-23 13:54:04)  Contact [facility] regarding leep

## 2013-11-20 LAB — IPS N GONORRHOEA AND CHLAMYDIA BY PCR

## 2013-11-20 LAB — IPS PAP TEST WITH HPV

## 2013-12-13 ENCOUNTER — Ambulatory Visit (INDEPENDENT_AMBULATORY_CARE_PROVIDER_SITE_OTHER): Payer: BC Managed Care – PPO | Admitting: Internal Medicine

## 2013-12-13 ENCOUNTER — Telehealth: Payer: Self-pay

## 2013-12-13 ENCOUNTER — Encounter: Payer: Self-pay | Admitting: Internal Medicine

## 2013-12-13 ENCOUNTER — Encounter (INDEPENDENT_AMBULATORY_CARE_PROVIDER_SITE_OTHER): Payer: Self-pay

## 2013-12-13 ENCOUNTER — Ambulatory Visit (INDEPENDENT_AMBULATORY_CARE_PROVIDER_SITE_OTHER)
Admission: RE | Admit: 2013-12-13 | Discharge: 2013-12-13 | Disposition: A | Discharge status: Home / Self Care | Payer: BC Managed Care – PPO | Source: Ambulatory Visit | Attending: Internal Medicine | Admitting: Internal Medicine

## 2013-12-13 VITALS — BP 102/70 | HR 91 | Ht 65.0 in | Wt 138.0 lb

## 2013-12-13 DIAGNOSIS — F17200 Nicotine dependence, unspecified, uncomplicated: Secondary | ICD-10-CM

## 2013-12-13 DIAGNOSIS — K219 Gastro-esophageal reflux disease without esophagitis: Secondary | ICD-10-CM

## 2013-12-13 DIAGNOSIS — J209 Acute bronchitis, unspecified: Secondary | ICD-10-CM

## 2013-12-13 DIAGNOSIS — J42 Unspecified chronic bronchitis: Secondary | ICD-10-CM

## 2013-12-13 MED ORDER — BENZONATATE 100 MG PO CAPS
ORAL_CAPSULE | ORAL | Status: DC
Start: 2013-12-13 — End: 2014-12-03

## 2013-12-13 NOTE — Telephone Encounter (Signed)
Created in error

## 2013-12-13 NOTE — Patient Instructions (Addendum)
Order- CXR  Dx bronchitis, tobacco user   Order- schedule PFT  Suggest - for the acid indigestion/ GERD try otc pepcid/famotidine or omeprazole, taking one daily before breakfast  Script for benzonatate perles to try for cough if needed

## 2013-12-13 NOTE — Progress Notes (Signed)
Subjective:    Patient ID: Heather Mclean, female    DOB: 01/05/1981, 33 y.o.   MRN: [id number]  HPI 12/13/13- 32 yoFsmoker (1ppd/ 15 pk yr)-Self Referral for Cough and Wheezing. She says she has coughed and wheezed for years and accepts that smoking makes it worse. No prior evaluation. Cough worst in the morning and will sometimes cough until she retches. Boyfriend is smoker. Some wheeze, some shortness of breath, no regular exercise. No previous chest x-ray. Denies fever, blood, chest pain, swollen glands. Failed Chantix because of mood change.Has tried Vapes but prefers tobacco. Denies history of asthma, pneumonia, seasonal allergy. Admits reflux/heartburn but it does not wake her. No ENT surgery except wisdom teeth. 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  Prior to Admission medications   Medication Sig Start Date End Date Taking? Authorizing Provider  benzonatate (TESSALON) 100 MG capsule 1 every 6-8 hours if needed for cough 12/13/13   Waymon Budge, MD   Past Medical History  Diagnosis Date  . TOBACCO USER   . SINUSITIS- ACUTE-NOS   . SORE THROAT   . AMENORRHEA   . Anxiety   . Depression   . HPV (human papilloma virus) anogenital infection Age 87   Past Surgical History  Procedure Laterality Date  . Colposcopy  Age 31  . Wisdom tooth extraction  Age 47  . 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 No pertinent family history. History   Social History  . Marital Status: Single    Spouse Name: N/A    Number of Children: N/A  . Years of Education: N/A   Occupational History  . Not on file.   Social History Main Topics  . Smoking status: Current Every Day Smoker -- 1.00 packs/day for 15 years    Types: Cigarettes  . Smokeless tobacco: Never Used  . Alcohol Use: 10.5 oz/week    21 drink(s) per week  . Drug Use: No  . Sexual Activity: Yes    Partners: Male    Birth Control/ Protection: None   Other Topics  Concern  . Not on file   Social History Narrative  . No narrative on file     Review of Systems  Constitutional: Negative for fever, chills, diaphoresis, activity change, appetite change, fatigue and unexpected weight change.  HENT: Negative for congestion, dental problem, ear discharge, ear pain, facial swelling, hearing loss, mouth sores, nosebleeds, postnasal drip, rhinorrhea, sinus pressure, sneezing, sore throat, tinnitus, trouble swallowing and voice change.   Eyes: Negative for photophobia, discharge, itching and visual disturbance.  Respiratory: Positive for cough, shortness of breath and wheezing. Negative for apnea, choking, chest tightness and stridor.   Cardiovascular: Negative for chest pain, palpitations and leg swelling.  Gastrointestinal: Negative for nausea, vomiting, abdominal pain, constipation, blood in stool and abdominal distention.  Genitourinary: Negative for dysuria, urgency, frequency, hematuria, flank pain, decreased urine volume and difficulty urinating.  Musculoskeletal: Negative for arthralgias, back pain, gait problem, joint swelling, myalgias, neck pain and neck stiffness.  Skin: Negative for color change, pallor and rash.  Neurological: Negative for dizziness, tremors, seizures, syncope, speech difficulty, weakness, light-headedness, numbness and headaches.  Hematological: Negative for adenopathy. Does not bruise/bleed easily.  Psychiatric/Behavioral: Negative for confusion, sleep disturbance and agitation. The patient is not nervous/anxious.        Objective:   Physical Exam OBJ- Physical Exam General- Alert, Oriented, Affect-appropriate, Distress- none acute, WDWN Skin-  rash-none, lesions- none, excoriation- none Lymphadenopathy- none Head- atraumatic            Eyes- Gross vision intact, PERRLA, conjunctivae and secretions clear            Ears- Hearing, canals-normal            Nose- Clear, no-Septal dev, mucus, polyps, erosion, perforation              Throat- Mallampati II , mucosa +Red , drainage- none, tonsils- atrophic Neck- flexible , trachea midline, no stridor , thyroid nl, carotid no bruit Chest - symmetrical excursion , unlabored           Heart/CV- RRR , no murmur , no gallop  , no rub, nl s1 s2                           - JVD- none , edema- none, stasis changes- none, varices- none           Lung- clear to P&A, wheeze- none, cough- none , dullness-none, rub- none           Chest wall-  Abd- tender-no, distended-no, bowel sounds-present, HSM- no Br/ Gen/ Rectal- Not done, not indicated Extrem- cyanosis- none, clubbing, none, atrophy- none, strength- nl Neuro- grossly intact to observation         Assessment & Plan:

## 2013-12-13 NOTE — Progress Notes (Signed)
Quick Note:  Spoke with pt, she is aware of cxr results. Nothing further needed. Caulfield,Ashley L ______

## 2013-12-24 ENCOUNTER — Encounter: Payer: Self-pay | Admitting: Gynecology

## 2014-01-06 DIAGNOSIS — K219 Gastro-esophageal reflux disease without esophagitis: Secondary | ICD-10-CM | POA: Insufficient documentation

## 2014-01-06 DIAGNOSIS — J4489 Other specified chronic obstructive pulmonary disease: Secondary | ICD-10-CM | POA: Insufficient documentation

## 2014-01-06 DIAGNOSIS — J449 Chronic obstructive pulmonary disease, unspecified: Secondary | ICD-10-CM | POA: Insufficient documentation

## 2014-01-06 NOTE — Assessment & Plan Note (Addendum)
Education including discussion of potential aggravation of respiratory symptoms Plan-acid blocker

## 2014-01-06 NOTE — Assessment & Plan Note (Signed)
Counseling done 

## 2014-01-06 NOTE — Assessment & Plan Note (Signed)
Dominant problem is tobacco use as discussed. Also discussed importance of regular exercise for stamina. Plan- [facility] smoking cessation program information, scheduled chest x-ray, PFT. Rescue inhaler

## 2014-05-13 ENCOUNTER — Encounter: Payer: Self-pay | Admitting: Internal Medicine

## 2014-05-13 ENCOUNTER — Ambulatory Visit (INDEPENDENT_AMBULATORY_CARE_PROVIDER_SITE_OTHER): Payer: BC Managed Care – PPO | Admitting: Internal Medicine

## 2014-05-13 VITALS — BP 120/86 | HR 79 | Ht 65.0 in | Wt 139.0 lb

## 2014-05-13 DIAGNOSIS — J209 Acute bronchitis, unspecified: Secondary | ICD-10-CM

## 2014-05-13 DIAGNOSIS — F17200 Nicotine dependence, unspecified, uncomplicated: Secondary | ICD-10-CM

## 2014-05-13 DIAGNOSIS — J449 Chronic obstructive pulmonary disease, unspecified: Secondary | ICD-10-CM

## 2014-05-13 LAB — PULMONARY FUNCTION TEST
DL/VA % pred: 100 %
DL/VA: 4.95 ml/min/mmHg/L
DLCO unc % pred: 98 %
DLCO unc: 25.25 ml/min/mmHg
FEF 25-75 Post: 3.39 L/sec
FEF 25-75 Pre: 2.31 L/sec
FEF2575-%Change-Post: 46 %
FEF2575-%Pred-Post: 97 %
FEF2575-%Pred-Pre: 66 %
FEV1-%Change-Post: 10 %
FEV1-%Pred-Post: 102 %
FEV1-%Pred-Pre: 92 %
FEV1-Post: 3.34 L
FEV1-Pre: 3.02 L
FEV1FVC-%Change-Post: 8 %
FEV1FVC-%Pred-Pre: 88 %
FEV6-%Change-Post: 1 %
FEV6-%Pred-Post: 107 %
FEV6-%Pred-Pre: 105 %
FEV6-Post: 4.14 L
FEV6-Pre: 4.07 L
FEV6FVC-%Pred-Post: 101 %
FEV6FVC-%Pred-Pre: 101 %
FVC-%Change-Post: 1 %
FVC-%Pred-Post: 106 %
FVC-%Pred-Pre: 104 %
FVC-Post: 4.14 L
FVC-Pre: 4.07 L
Post FEV1/FVC ratio: 81 %
Post FEV6/FVC ratio: 100 %
Pre FEV1/FVC ratio: 74 %
Pre FEV6/FVC Ratio: 100 %
RV % pred: 92 %
RV: 1.35 L
TLC % pred: 99 %
TLC: 5.19 L

## 2014-05-13 MED ORDER — BUDESONIDE-FORMOTEROL FUMARATE 80-4.5 MCG/ACT IN AERO: 2.0000 | INHALATION_SPRAY | Freq: Two times a day (BID) | RESPIRATORY_TRACT | Status: DC

## 2014-05-13 NOTE — Progress Notes (Signed)
Subjective:    Patient ID: Heather Mclean, female    DOB: [birth year], 33 y.o.   MRN: [id number]  HPI 12/13/13- 32 yoFsmoker (1ppd/ 15 pk yr)-Self Referral for Cough and Wheezing. She says she has coughed and wheezed for years and accepts that smoking makes it worse. No prior evaluation. Cough worst in the morning and will sometimes cough until she retches. Boyfriend is smoker. Some wheeze, some shortness of breath, no regular exercise. No previous chest x-ray. Denies fever, blood, chest pain, swollen glands. Failed Chantix because of mood change.Has tried Vapes but prefers tobacco. Denies history of asthma, pneumonia, seasonal allergy. Admits reflux/heartburn but it does not wake her. No ENT surgery except wisdom teeth. 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  05/13/14- 32 yoFsmoker (1ppd/ 15 pk yr)-followed for asthma/bronchitis, tobacco abuse, complicated by GERD FOLLOWS FOR: Pt states cough and wheezing are still the same; Review PFT results with patient. Still coughing some clear mucus. Still smokes. Occasional mild heartburn CXR 12/13/13 IMPRESSION:  Minimal peribronchial thickening and hyperinflation which could  reflect bronchitis or asthma.  No acute infiltrate.  Electronically Signed  By: Ulyses Southward M.D.  On: 12/13/2013 13:25  PFT -05/13/14- minimal small airway obstructive disease with response to bronchodilator only in small airways. Normal diffusion, normal lung volumes. FVC 4.14/106%, FEV1 3.34/102%, FEV1/FVC 0.81, FEF 25-75% to 3.39/97% after dilator  ROS-see HPI Constitutional:   No-   weight loss, night sweats, fevers, chills, fatigue, lassitude. HEENT:   No-  headaches, difficulty swallowing, tooth/dental problems, sore throat,       No-  sneezing, itching, ear ache, nasal congestion, post nasal drip,  CV:  No-   chest pain, orthopnea, PND, swelling in lower extremities, anasarca,                                  dizziness,  palpitations Resp: No-   shortness of breath with exertion or at rest.              +   productive cough,  + non-productive cough,  No- coughing up of blood.              No-   change in color of mucus.  No- wheezing.   Skin: No-   rash or lesions. GI:  No-   heartburn, indigestion, abdominal pain, nausea, vomiting,  GU:  MS:  No-   joint pain or swelling.   Neuro-     nothing unusual Psych:  No- change in mood or affect. No depression or anxiety.  No memory loss.  Objective:   Physical Exam OBJ- Physical Exam General- Alert, Oriented, Affect-appropriate, Distress- none acute, WDWN Skin- rash-none, lesions- none, excoriation- none Lymphadenopathy- none Head- atraumatic            Eyes- Gross vision intact, PERRLA, conjunctivae and secretions clear            Ears- Hearing, canals-normal            Nose- Clear, no-Septal dev, mucus, polyps, erosion, perforation             Throat- Mallampati II , mucosa +Red , drainage- none, tonsils- atrophic Neck- flexible , trachea midline, no stridor , thyroid nl, carotid no bruit Chest - symmetrical excursion , unlabored           Heart/CV- RRR , no murmur , no gallop  ,  no rub, nl s1 s2                           - JVD- none , edema- none, stasis changes- none, varices- none           Lung- clear to P&A, wheeze- none, cough+slight , dullness-none, rub- none           Chest wall-  Abd-  Br/ Gen/ Rectal- Not done, not indicated Extrem- cyanosis- none, clubbing, none, atrophy- none, strength- nl Neuro- grossly intact to observation  Assessment & Plan:

## 2014-05-13 NOTE — Progress Notes (Signed)
PFT done today. 

## 2014-05-13 NOTE — Patient Instructions (Signed)
Please do everything you can now to stop smoking while you can  Sample Symbicort 80 maintenance inhaler  2 puffs then rinse mouth, twice daily

## 2014-07-13 ENCOUNTER — Encounter: Payer: Self-pay | Admitting: Internal Medicine

## 2014-07-13 NOTE — Assessment & Plan Note (Signed)
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

## 2014-07-13 NOTE — Assessment & Plan Note (Signed)
Counseling reinforced 

## 2014-09-12 ENCOUNTER — Ambulatory Visit: Payer: BC Managed Care – PPO | Admitting: Internal Medicine

## 2014-09-12 ENCOUNTER — Encounter: Payer: Self-pay | Admitting: Internal Medicine

## 2014-10-07 ENCOUNTER — Encounter: Payer: Self-pay | Admitting: Internal Medicine

## 2014-10-29 ENCOUNTER — Telehealth: Payer: Self-pay

## 2014-10-29 NOTE — Telephone Encounter (Signed)
Appointment reschedule from 11/22/14 to 12/05/14 @ 2:45 with Ms. Debbi. Pt informed and voiced understanding  Encounter closed.

## 2014-11-22 ENCOUNTER — Ambulatory Visit: Payer: BC Managed Care – PPO | Admitting: Gynecology

## 2014-11-25 ENCOUNTER — Telehealth: Payer: Self-pay | Admitting: Certified Nurse Midwife

## 2014-11-25 NOTE — Telephone Encounter (Signed)
Left msg to reschedule 12/31/ appt.

## 2014-12-03 ENCOUNTER — Encounter: Payer: Self-pay | Admitting: Nurse Practitioner

## 2014-12-03 ENCOUNTER — Ambulatory Visit (INDEPENDENT_AMBULATORY_CARE_PROVIDER_SITE_OTHER): Payer: BC Managed Care – PPO | Admitting: Nurse Practitioner

## 2014-12-03 VITALS — BP 104/62 | HR 80 | Resp 16 | Ht 66.0 in | Wt 141.0 lb

## 2014-12-03 DIAGNOSIS — Z0000 Encounter for general adult medical examination without abnormal findings: Secondary | ICD-10-CM

## 2014-12-03 DIAGNOSIS — N943 Premenstrual tension syndrome: Secondary | ICD-10-CM

## 2014-12-03 DIAGNOSIS — Z01419 Encounter for gynecological examination (general) (routine) without abnormal findings: Secondary | ICD-10-CM

## 2014-12-03 DIAGNOSIS — N926 Irregular menstruation, unspecified: Secondary | ICD-10-CM

## 2014-12-03 DIAGNOSIS — Z113 Encounter for screening for infections with a predominantly sexual mode of transmission: Secondary | ICD-10-CM

## 2014-12-03 MED ORDER — DESOGESTREL-ETHINYL ESTRADIOL 0.15-0.02/0.01 MG (21/5) PO TABS: 1.0000 | ORAL_TABLET | Freq: Every day | ORAL | Status: DC

## 2014-12-03 NOTE — Patient Instructions (Addendum)
EXERCISE AND DIET:  We recommended that you start or continue a regular exercise program for good health. Regular exercise means any activity that makes your heart beat faster and makes you sweat.  We recommend exercising at least 30 minutes per day at least 3 days a week, preferably 4 or 5.  We also recommend a diet low in fat and sugar.  Inactivity, poor dietary choices and obesity can cause diabetes, heart attack, stroke, and kidney damage, among others.    ALCOHOL AND SMOKING:  Women should limit their alcohol intake to no more than 7 drinks/beers/glasses of wine (combined, not each!) per week. Moderation of alcohol intake to this level decreases your risk of breast cancer and liver damage. And of course, no recreational drugs are part of a healthy lifestyle.  And absolutely no smoking or even second hand smoke. Most people know smoking can cause heart and lung diseases, but did you know it also contributes to weakening of your bones? Aging of your skin?  Yellowing of your teeth and nails?  CALCIUM AND VITAMIN D:  Adequate intake of calcium and Vitamin D are recommended.  The recommendations for exact amounts of these supplements seem to change often, but generally speaking 600 mg of calcium (either carbonate or citrate) and 800 units of Vitamin D per day seems prudent. Certain women may benefit from higher intake of Vitamin D.  If you are among these women, your doctor will have told you during your visit.    PAP SMEARS:  Pap smears, to check for cervical cancer or precancers,  have traditionally been done yearly, although recent scientific advances have shown that most women can have pap smears less often.  However, every woman still should have a physical exam from her gynecologist every year. It will include a breast check, inspection of the vulva and vagina to check for abnormal growths or skin changes, a visual exam of the cervix, and then an exam to evaluate the size and shape of the uterus and  ovaries.  And after 32 years of age, a rectal exam is indicated to check for rectal cancers. We will also provide age appropriate advice regarding health maintenance, like when you should have certain vaccines, screening for sexually transmitted diseases, bone density testing, colonoscopy, mammograms, etc.   MAMMOGRAMS:  All women over 40 years old should have a yearly mammogram. Many facilities now offer a "3D" mammogram, which may cost around $50 extra out of pocket. If possible,  we recommend you accept the option to have the 3D mammogram performed.  It both reduces the number of women who will be called back for extra views which then turn out to be normal, and it is better than the routine mammogram at detecting truly abnormal areas.    COLONOSCOPY:  Colonoscopy to screen for colon cancer is recommended for all women at age 50.  We know, you hate the idea of the prep.  We agree, BUT, having colon cancer and not knowing it is worse!!  Colon cancer so often starts as a polyp that can be seen and removed at colonscopy, which can quite literally save your life!  And if your first colonoscopy is normal and you have no family history of colon cancer, most women don't have to have it again for 10 years.  Once every ten years, you can do something that may end up saving your life, right?  We will be happy to help you get it scheduled when you are ready.    Be sure to check your insurance coverage so you understand how much it will cost.  It may be covered as a preventative service at no cost, but you should check your particular policy.       Oral Contraception Information Oral contraceptive pills (OCPs) are medicines taken to prevent pregnancy. OCPs work by preventing the ovaries from releasing eggs. The hormones in OCPs also cause the cervical mucus to thicken, preventing the sperm from entering the uterus. The hormones also cause the uterine lining to become thin, not allowing a fertilized egg to attach to  the inside of the uterus. OCPs are highly effective when taken exactly as prescribed. However, OCPs do not prevent sexually transmitted diseases (STDs). Safe sex practices, such as using condoms along with the pill, can help prevent STDs.  Before taking the pill, you may have a physical exam and Pap test. Your health care provider may order blood tests. The health care provider will make sure you are a good candidate for oral contraception. Discuss with your health care provider the possible side effects of the OCP you may be prescribed. When starting an OCP, it can take 2 to 3 months for the body to adjust to the changes in hormone levels in your body.  TYPES OF ORAL CONTRACEPTION  The combination pill--This pill contains estrogen and progestin (synthetic progesterone) hormones. The combination pill comes in 21-day, 28-day, or 91-day packs. Some types of combination pills are meant to be taken continuously (365-day pills). With 21-day packs, you do not take pills for 7 days after the last pill. With 28-day packs, the pill is taken every day. The last 7 pills are without hormones. Certain types of pills have more than 21 hormone-containing pills. With 91-day packs, the first 84 pills contain both hormones, and the last 7 pills contain no hormones or contain estrogen only.  The minipill--This pill contains the progesterone hormone only. The pill is taken every day continuously. It is very important to take the pill at the same time each day. The minipill comes in packs of 28 pills. All 28 pills contain the hormone.  ADVANTAGES OF ORAL CONTRACEPTIVE PILLS  Decreases premenstrual symptoms.   Treats menstrual period cramps.   Regulates the menstrual cycle.   Decreases a heavy menstrual flow.   May treatacne, depending on the type of pill.   Treats abnormal uterine bleeding.   Treats polycystic ovarian syndrome.   Treats endometriosis.   Can be used as emergency contraception.  THINGS  THAT CAN MAKE ORAL CONTRACEPTIVE PILLS LESS EFFECTIVE OCPs can be less effective if:   You forget to take the pill at the same time every day.   You have a stomach or intestinal disease that lessens the absorption of the pill.   You take OCPs with other medicines that make OCPs less effective, such as antibiotics, certain HIV medicines, and some seizure medicines.   You take expired OCPs.   You forget to restart the pill on day 7, when using the packs of 21 pills.  RISKS ASSOCIATED WITH ORAL CONTRACEPTIVE PILLS  Oral contraceptive pills can sometimes cause side effects, such as:  Headache.  Nausea.  Breast tenderness.  Irregular bleeding or spotting. Combination pills are also associated with a small increased risk of:  Blood clots.  Heart attack.  Stroke. Document Released: 02/12/2003 Document Revised: 09/12/2013 Document Reviewed: 05/13/2013 [facility] Patient Information 2015 [employer], [place]. This information is not intended to replace advice given to you by your health care provider.  Make sure you discuss any questions you have with your health care provider.  Start on [name] the Sunday after menses starts - not ends.

## 2014-12-03 NOTE — Progress Notes (Signed)
33 y.o. [id number] Single Caucasian Fe here for annual exam.  Menses lasting 4 days. Heavy for 1-2 days, then moderate and light.  Dysmenorrhea.  Birth control method  Is withdrawal.  Same partner for 2 years.  She is getting more PMS and hormonal issues. She is considering going back on OCP.  In the past OCP caused 'weird' hormonal symptoms.  Patient's last menstrual period was 11/08/2014.          Sexually active: Yes.    The current method of family planning is none.    Exercising: No.  The patient does not participate in regular exercise at present. Smoker:  yes  Health Maintenance: Pap:  11/2013 Neg. HR HPV:neg TDaP: 2008 Labs:done here  UPT: negative   reports that she has been smoking Cigarettes.  She has a 15 pack-year smoking history. She has never used smokeless tobacco. She reports that she drinks about 10.5 oz of alcohol per week. She reports that she does not use illicit drugs.  Past Medical History  Diagnosis Date  . TOBACCO USER   . SINUSITIS- ACUTE-NOS   . SORE THROAT   . AMENORRHEA   . Anxiety   . Depression   . HPV (human papilloma virus) anogenital infection Age 33  . 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 Colposcopy  Age 89  . Wisdom tooth extraction  Age 89  . Leep  2001    CIN I, CIN II    Current Outpatient Prescriptions  Medication Sig Dispense Refill  . desogestrel-ethinyl estradiol ([name]) 0.15-0.02/0.01 MG (21/5) tablet Take 1 tablet by mouth daily. 3 Package 3   No current facility-administered medications for this visit.    History reviewed. No pertinent family history.  ROS:  Pertinent items are noted in HPI.  Otherwise, a comprehensive ROS was negative.  Exam:   BP 104/62 mmHg  Pulse 80  Resp 16  Ht 5\' 6"  (1.676 m)  Wt 141 lb (63.957 kg)  BMI 22.77 kg/m2  LMP 11/08/2014 Height: 5\' 6"  (167.6 cm)  Ht Readings from Last 3 Encounters:  12/03/14 5\' 6"  (1.676 m)  05/13/14 5\' 5"   (1.651 m)  12/13/13 5\' 5"  (1.651 m)    General appearance: alert, cooperative and appears stated age Head: Normocephalic, without obvious abnormality, atraumatic Neck: no adenopathy, supple, symmetrical, trachea midline and thyroid normal to inspection and palpation Lungs: clear to auscultation bilaterally Breasts: normal appearance, no masses or tenderness Heart: regular rate and rhythm Abdomen: soft, non-tender; no masses,  no organomegaly Extremities: extremities normal, atraumatic, no cyanosis or edema Skin: Skin color, texture, turgor normal. No rashes or lesions Lymph nodes: Cervical, supraclavicular, and axillary nodes normal. No abnormal inguinal nodes palpated Neurologic: Grossly normal   Pelvic: External genitalia:  no lesions              Urethra:  normal appearing urethra with no masses, tenderness or lesions              Bartholin's and Skene's: normal                 Vagina: normal appearing vagina with normal color and discharge, no lesions              Cervix: anteverted              Pap taken: Yes.   Bimanual Exam:  Uterus:  normal size, contour, position, consistency,  mobility, non-tender              Adnexa: no mass, fullness, tenderness               Rectovaginal: Confirms               Anus:  normal sphincter tone, no lesions  A:  Well Woman with normal exam  Currently withdrawal for birth control  Contraceptive management  R/O STD - panel not done at last AEX  History of PMS / PMDD   P:   Reviewed health and wellness pertinent to exam  Pap smear taken today  Discussion about different kinds of OCP and various combinations.  She is willing to try Garnette ScheuermannKariva since she needs birth control as well and call back if having symptoms.  Reviewed side effects and risk, discussed compliance, BUM , etc.  Hopefully this monophasic pill  will ease her PMS symptoms.  If not, we have also discussed adding Fluoxetine 10- 20 mg CD # 14 -28.  She would need to come in for a  consult visit if she needed something.  I do not want to add 2 new products at the same time in case of reaction or side effects.  She is also aware that she must stop smoking before age 33 if she wishes to stay on OCP.  Counseled on breast self exam, use and side effects of OCP's, adequate intake of calcium and vitamin D, diet and exercise return annually or prn  An After Visit Summary was printed and given to the patient.  Discussion about PMS / PMDD consultation at extra 15 minutes face to face.

## 2014-12-04 LAB — STD PANEL
HIV 1&2 Ab, 4th Generation: NONREACTIVE
Hepatitis B Surface Ag: NEGATIVE

## 2014-12-04 NOTE — Progress Notes (Signed)
Encounter reviewed by Dr. Malarie Tappen Silva.  

## 2014-12-05 ENCOUNTER — Ambulatory Visit: Payer: Self-pay | Admitting: Certified Nurse Midwife

## 2014-12-05 LAB — IPS PAP TEST WITH REFLEX TO HPV

## 2015-08-20 IMAGING — CR DG CHEST 2V
2 series · 2 of 2 positions shown · non-contrast
Comparison: None

CLINICAL DATA: Cough, wheezing, question chronic bronchitis,
history smoking

EXAM:
CHEST  2 VIEW

[view not recorded (1 of 2)]
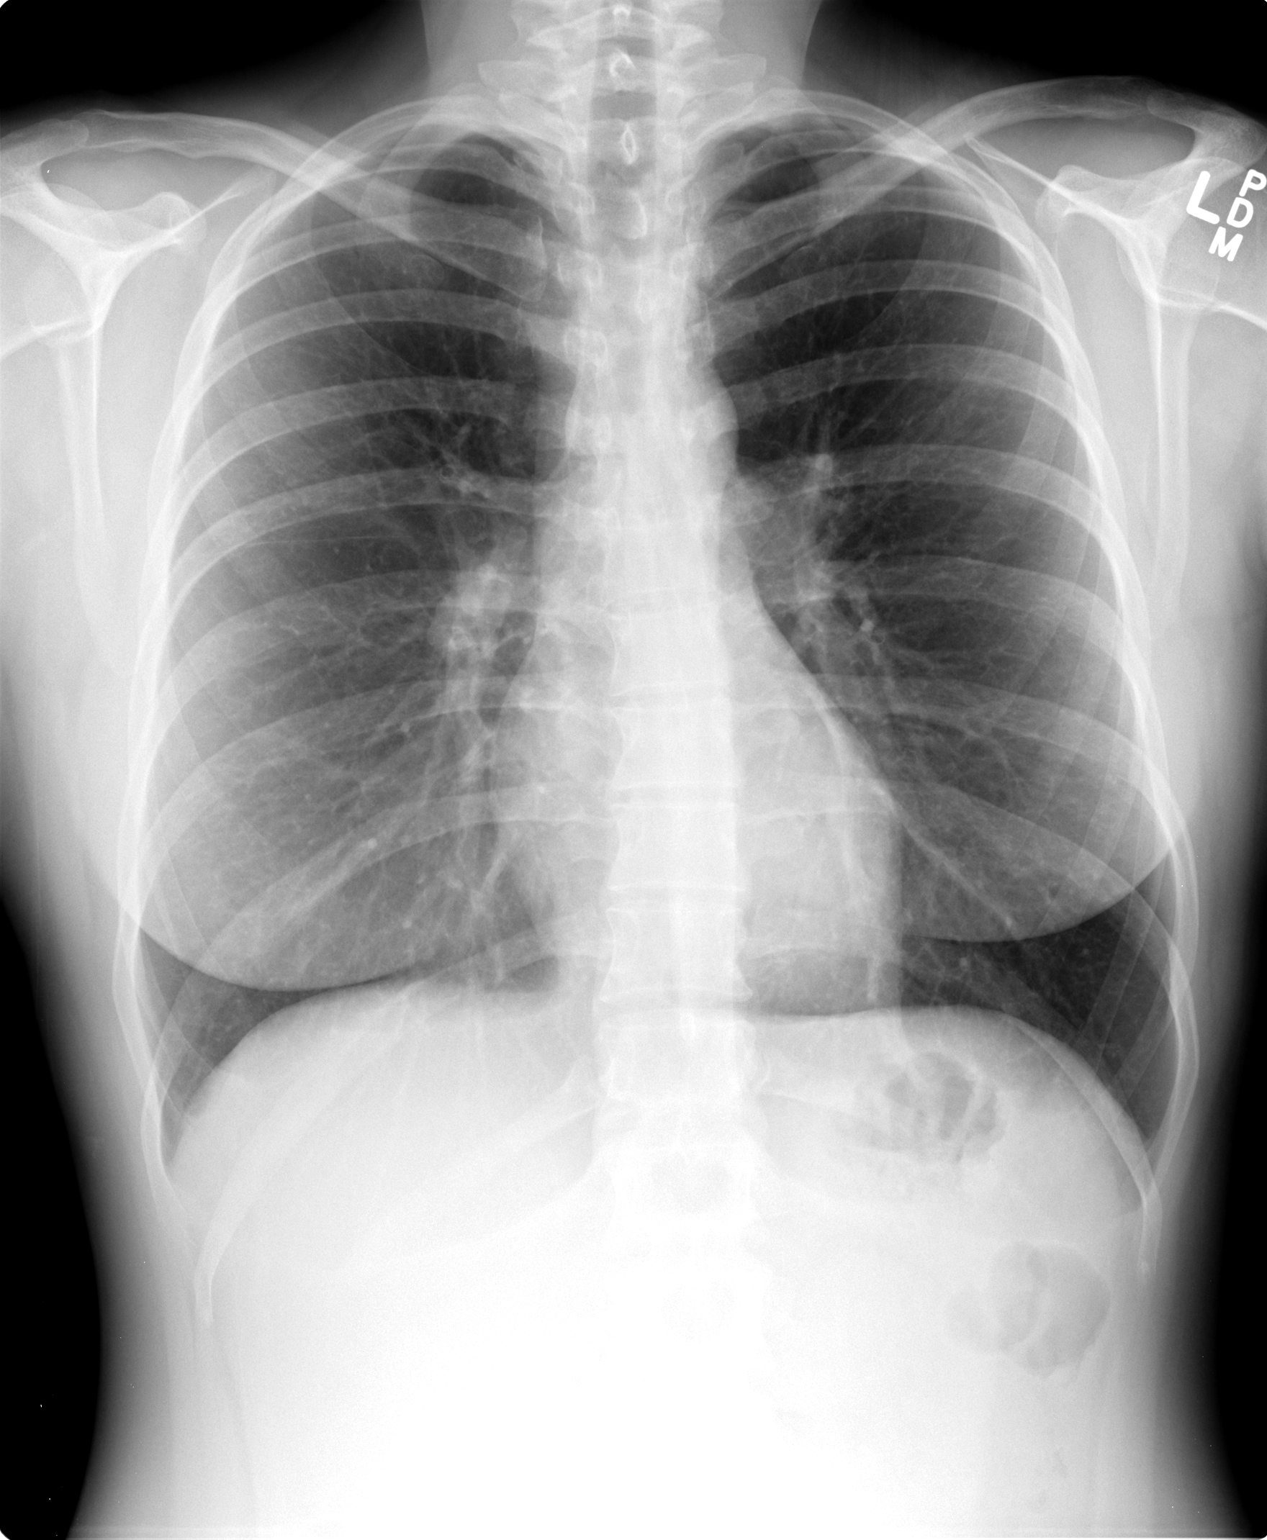

[view not recorded (2 of 2)]
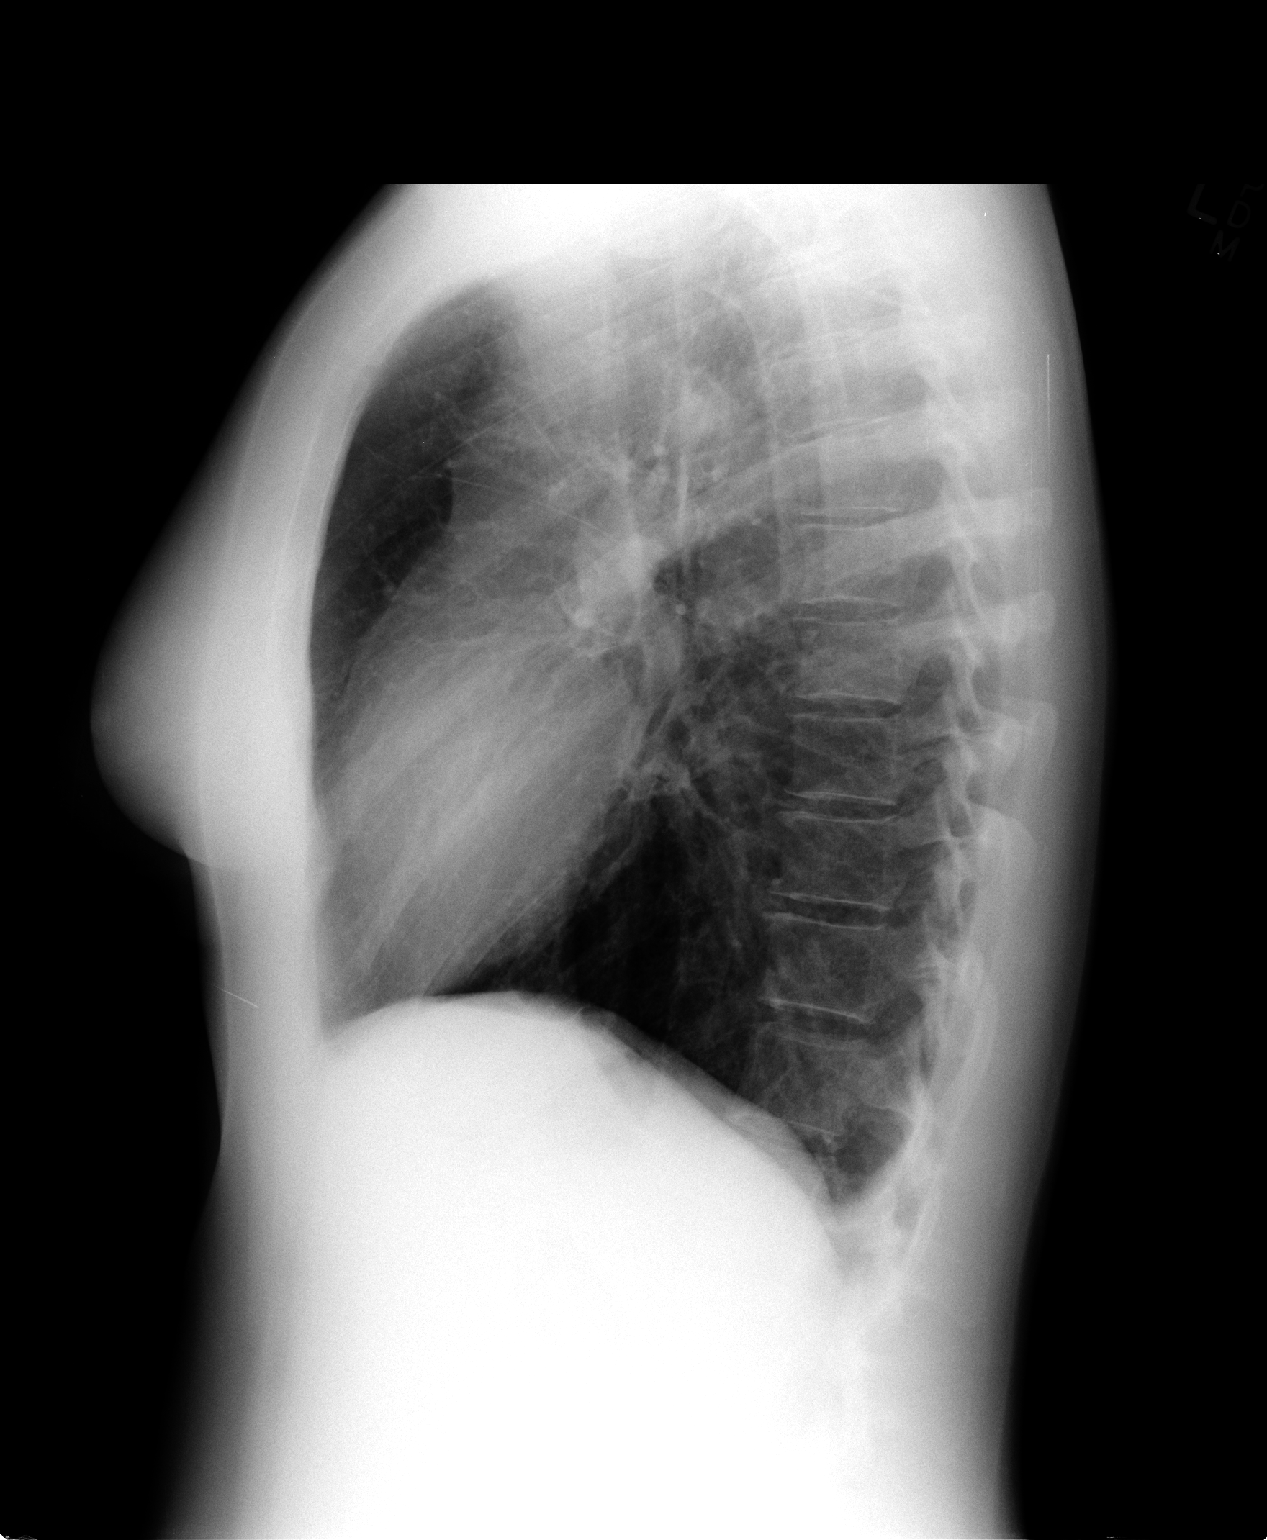

[2 of 2 positions shown; findings below may reference images not displayed]

FINDINGS: Normal heart size, mediastinal contours, and pulmonary vascularity.

Minimal peribronchial thickening.

Lungs slightly hyperinflated clear.

No pleural effusion or pneumothorax.

Bones unremarkable.
IMPRESSION: Minimal peribronchial thickening and hyperinflation which could
reflect bronchitis or asthma.

No acute infiltrate.

## 2015-11-12 ENCOUNTER — Telehealth: Payer: Self-pay | Admitting: Nurse Practitioner

## 2015-11-12 ENCOUNTER — Other Ambulatory Visit: Payer: Self-pay

## 2015-11-12 MED ORDER — DESOGESTREL-ETHINYL ESTRADIOL 0.15-0.02/0.01 MG (21/5) PO TABS: 1.0000 | ORAL_TABLET | Freq: Every day | ORAL | Status: DC

## 2015-11-12 NOTE — Telephone Encounter (Signed)
Medication refill request: [name] 0.15-0.02/0.01 mg  Last AEX:  12/03/2014 PG Next AEX: 12/12/2015 PG Last MMG (if hormonal medication request): None Refill authorized: 12/03/2014 [name] 3 packs 3 refills  Today: 3 packs 0 Refills?

## 2015-11-12 NOTE — Telephone Encounter (Signed)
Refill request routed to PG//kg

## 2015-11-12 NOTE — Telephone Encounter (Signed)
Patient calling requesting a refill on her birth control. Pharmacy on file is correct. °

## 2015-11-17 NOTE — Telephone Encounter (Signed)
Patient called and said, "I normally get azurette for my birth control but got kariva instead when I picked up my prescription. I'd like to go back to the one I normally get." Pharmacy on file is correct.

## 2015-11-17 NOTE — Telephone Encounter (Signed)
[name] sent 12/03/14 #3packs/3R.  [name] sent 11/12/15 #1pack/0R.   Called pharmacy they are not able to accept [name] back. They made a note to only give her Azurett brand going forward.   Called patient, she can take Heather Mclean this month only and then they would only give her Azurett. They are basically the same Rx just different brand. Patient verbalized understanding.

## 2015-12-12 ENCOUNTER — Encounter: Payer: Self-pay | Admitting: Nurse Practitioner

## 2015-12-12 ENCOUNTER — Ambulatory Visit (INDEPENDENT_AMBULATORY_CARE_PROVIDER_SITE_OTHER): Payer: 59 | Admitting: Nurse Practitioner

## 2015-12-12 VITALS — BP 130/82 | Ht 65.25 in | Wt 149.0 lb

## 2015-12-12 DIAGNOSIS — N760 Acute vaginitis: Secondary | ICD-10-CM

## 2015-12-12 DIAGNOSIS — Z01419 Encounter for gynecological examination (general) (routine) without abnormal findings: Secondary | ICD-10-CM

## 2015-12-12 DIAGNOSIS — Z0000 Encounter for general adult medical examination without abnormal findings: Secondary | ICD-10-CM | POA: Diagnosis not present

## 2015-12-12 LAB — POCT URINALYSIS DIPSTICK
Bilirubin, UA: NEGATIVE
Blood, UA: NEGATIVE
Glucose, UA: NEGATIVE
Ketones, UA: NEGATIVE
Leukocytes, UA: NEGATIVE
Nitrite, UA: NEGATIVE
Protein, UA: NEGATIVE
Urobilinogen, UA: NEGATIVE
pH, UA: 7

## 2015-12-12 MED ORDER — DESOGESTREL-ETHINYL ESTRADIOL 0.15-0.02/0.01 MG (21/5) PO TABS: 1.0000 | ORAL_TABLET | Freq: Every day | ORAL | Status: DC

## 2015-12-12 NOTE — Patient Instructions (Signed)

## 2015-12-12 NOTE — Progress Notes (Signed)
Patient ID: Heather Mclean, female   DOB: [birth year], 35 y.o.   MRN: [id number]  35 y.o. G2P0020 Single Caucasian Fe here for annual exam.  Menses now at 3-4 days, moderate to light. Did not like [place] generic.  Same partner for 3 years.  She is working full time doing [employer].  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  Very stressful time upcoming for 14 weeks during tax season.  She is now having concerns of low libido - realizes some of this is stress related.  She also has a vaginal odor and would like to be checked.  Patient's last menstrual period was 12/09/2015 (exact date).          Sexually active: Yes.    The current method of family planning is OCP (estrogen/progesterone).  Same partner for 3 years.  Exercising: No.  The patient does not participate in regular exercise at present. Smoker:  Yes, 1/2 pack per day  Health Maintenance: Pap:  12/03/14, negative with neg HR HPV TDaP:  Fall 2016 Hep C and HIV: Hep C not indicated due to age; HIV completed 12/03/14 Labs: PCP Fall 2016  Urine: Negative    reports that she has been smoking Cigarettes.  She has a 15 pack-year smoking history. She has never used smokeless tobacco. She reports that she drinks about 10.5 oz of alcohol per week. She reports that she does not use illicit drugs.  Past Medical History  Diagnosis Date  . TOBACCO USER   . SINUSITIS- ACUTE-NOS   . SORE THROAT   . AMENORRHEA   . Anxiety   . Depression   . 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 Colposcopy  Age 35  . Wisdom tooth extraction  Age 35  . Leep  35 2001    CIN I, CIN II    Current Outpatient Prescriptions  Medication Sig Dispense Refill  . desogestrel-ethinyl estradiol ([name],AZURETTE,MIRCETTE) 0.15-0.02/0.01 MG (21/5) tablet Take 1 tablet by mouth daily. 3 Package 4   No current  facility-administered medications for this visit.    History reviewed. No pertinent family history.  ROS:  Pertinent items are noted in HPI.  Otherwise, a comprehensive ROS was negative.  Exam:   BP 130/82 mmHg  Ht 5' 5.25" (1.657 m)  Wt 149 lb (67.586 kg)  BMI 24.62 kg/m2  LMP 12/09/2015 (Exact Date) Height: 5' 5.25" (165.7 cm) Ht Readings from Last 3 Encounters:  12/12/15 5' 5.25" (1.657 m)  12/03/14 5\' 6"  (1.676 m)  05/13/14 5\' 5"  (1.651 m)    General appearance: alert, cooperative and appears stated age Head: Normocephalic, without obvious abnormality, atraumatic Neck: no adenopathy, supple, symmetrical, trachea midline and thyroid normal to inspection and palpation Lungs: clear to auscultation bilaterally Breasts: normal appearance, no masses or tenderness Heart: regular rate and rhythm Abdomen: soft, non-tender; no masses,  no organomegaly Extremities: extremities normal, atraumatic, no cyanosis or edema Skin: Skin color, texture, turgor normal. No rashes or lesions Lymph nodes: Cervical, supraclavicular, and axillary nodes normal. No abnormal inguinal nodes palpated Neurologic: Grossly normal   Pelvic: External genitalia:  no lesions              Urethra:  normal appearing urethra with no masses, tenderness or lesions  Bartholin's and Skene's: normal                 Vagina: normal appearing vagina with normal color and light pink vaginal bleeding but no other discharge, no lesions              Cervix: anteverted              Pap taken: No. Bimanual Exam:  Uterus:  normal size, contour, position, consistency, mobility, non-tender              Adnexa: no mass, fullness, tenderness               Rectovaginal: Confirms               Anus:  normal sphincter tone, no lesions  Chaperone present: yes  A:  Well Woman with normal exam  OCP for birth control   Remote history of CIN I/II at age 35   History of PMS / PMDD  Complaints of low libido  R/O  vaginitis   P:   Reviewed health and wellness pertinent to exam  Pap smear as above  Refill on Azurette OCP for a year  Counseled on breast self exam, STD prevention, HIV risk factors and prevention, use and side effects of OCP's, adequate intake of calcium and vitamin D, diet and exercise return annually or prn  An After Visit Summary was printed and given to the patient.

## 2015-12-13 LAB — WET PREP BY MOLECULAR PROBE
Candida species: NEGATIVE
Gardnerella vaginalis: NEGATIVE
Trichomonas vaginosis: NEGATIVE

## 2015-12-13 NOTE — Progress Notes (Signed)
Encounter reviewed by Dr. Janean SarkBrook Amundson C. Silva. Will need to stop combined oral contraceptives at age 35 if still smoking.

## 2015-12-15 ENCOUNTER — Telehealth: Payer: Self-pay | Admitting: Nurse Practitioner

## 2015-12-15 NOTE — Telephone Encounter (Signed)
Called patient and told her that wet prep was negative. She was appreciative of the call.

## 2016-02-09 ENCOUNTER — Ambulatory Visit (INDEPENDENT_AMBULATORY_CARE_PROVIDER_SITE_OTHER): Payer: 59 | Admitting: Nurse Practitioner

## 2016-02-09 ENCOUNTER — Encounter: Payer: Self-pay | Admitting: Nurse Practitioner

## 2016-02-09 VITALS — BP 112/80 | HR 72 | Ht 65.25 in | Wt 155.0 lb

## 2016-02-09 DIAGNOSIS — R3915 Urgency of urination: Secondary | ICD-10-CM | POA: Diagnosis not present

## 2016-02-09 DIAGNOSIS — N9089 Other specified noninflammatory disorders of vulva and perineum: Secondary | ICD-10-CM

## 2016-02-09 DIAGNOSIS — N760 Acute vaginitis: Secondary | ICD-10-CM

## 2016-02-09 LAB — POCT URINALYSIS DIPSTICK
Bilirubin, UA: NEGATIVE
Glucose, UA: NEGATIVE
Ketones, UA: NEGATIVE
Nitrite, UA: NEGATIVE
Protein, UA: NEGATIVE
Urobilinogen, UA: NEGATIVE
pH, UA: 5

## 2016-02-09 NOTE — Progress Notes (Signed)
35 y.o. Single Caucasian female G2P0020 here with complaint of vaginal symptoms of itching, burning, and raw feeling on the left labia. Describes discharge as minimal. Onset of symptoms 10-14 days ago. Denies new personal products or vaginal dryness. No STD concerns, same partner for 3 yrs. Not SA in about a month.  He has no known history of HSV.  Some urinary symptoms of urgency and frequency. Contraception is OCP. Over this week she feels the symptoms are better.  She has not noted a blister but does think the lesions are more dry and not as painful as earlier.  LMP 02/04/16    O:  Healthy female WDWN Affect: normal, orientation x 3  Exam: no distress Abdomen: soft and non tender Lymph node: no enlargement or tenderness Pelvic exam: External genital: normal female with drying lesions on the left labia - very suspicious for HSV.  Culture is taken but with the drying stages it may be negative, BUS: negative Vagina: light brown discharge noted.  Affirm taken. Cervix: normal, non tender, no CMT Uterus: normal, non tender Adnexa:normal, non tender, no masses or fullness noted    A: Vaginitis  R/O HSV   P: Discussed findings of vaginitis and etiology. Discussed Aveeno or baking soda sitz bath for comfort. Discussed possible HSV origin for type I & II.  She will contact partner and see if he has ever been tested.   Olive Oil/Coconut Oil use for skin protection prior to activity can be used to external skin.  Rx: no med's yet pending Affirm and culture  Follow with Affirm and HSV culture  Note:  She is aware that culture may be a false negative.  If she ever has another outbreak she will call us immediately and we will re culture. She declines antivirals at this time since the lesions are healing and dry.  RV prn

## 2016-02-09 NOTE — Patient Instructions (Signed)
We will call you with test results

## 2016-02-10 LAB — WET PREP BY MOLECULAR PROBE
Candida species: NEGATIVE
Gardnerella vaginalis: NEGATIVE
Trichomonas vaginosis: NEGATIVE

## 2016-02-11 ENCOUNTER — Telehealth: Payer: Self-pay

## 2016-02-11 LAB — URINE CULTURE: Colony Count: 15000

## 2016-02-11 LAB — HERPES SIMPLEX VIRUS CULTURE: Organism ID, Bacteria: NOT DETECTED

## 2016-02-11 NOTE — Telephone Encounter (Signed)
-----   Message from Ria CommentPatricia Grubb, FNP sent at 02/11/2016 12:43 PM EST ----- Please let pt know that HSV culture was negative - but we discussed that because lesions were dry may not be a good culture.  She is to call us if she ever has another episode / lesion like this.  The urine culture was also negative.  Is she better?

## 2016-02-11 NOTE — Telephone Encounter (Signed)
Spoke with patient. Results given as seen below from Ria CommentPatricia Grubb, FNP. She is agreeable and verbalizes understanding. Reports she is feeling better. Denies any current symptoms or concerns.  Routing to provider for final review. Patient agreeable to disposition. Will close encounter.

## 2016-02-13 NOTE — Progress Notes (Signed)
Encounter reviewed by Dr. Buddy Loeffelholz Amundson C. Silva.  

## 2016-05-05 ENCOUNTER — Encounter: Payer: Self-pay | Admitting: Internal Medicine

## 2016-05-05 ENCOUNTER — Ambulatory Visit (INDEPENDENT_AMBULATORY_CARE_PROVIDER_SITE_OTHER)
Admission: RE | Admit: 2016-05-05 | Discharge: 2016-05-05 | Disposition: A | Discharge status: Home / Self Care | Payer: 59 | Source: Ambulatory Visit | Attending: Internal Medicine | Admitting: Internal Medicine

## 2016-05-05 ENCOUNTER — Ambulatory Visit (INDEPENDENT_AMBULATORY_CARE_PROVIDER_SITE_OTHER): Payer: 59 | Admitting: Internal Medicine

## 2016-05-05 VITALS — BP 118/78 | HR 87 | Temp 98.4°F | Ht 65.0 in | Wt 152.8 lb

## 2016-05-05 DIAGNOSIS — J449 Chronic obstructive pulmonary disease, unspecified: Secondary | ICD-10-CM

## 2016-05-05 DIAGNOSIS — F17200 Nicotine dependence, unspecified, uncomplicated: Secondary | ICD-10-CM

## 2016-05-05 DIAGNOSIS — J45909 Unspecified asthma, uncomplicated: Secondary | ICD-10-CM

## 2016-05-05 DIAGNOSIS — Z720 Tobacco use: Secondary | ICD-10-CM | POA: Diagnosis not present

## 2016-05-05 NOTE — Progress Notes (Signed)
Subjective:    Patient ID: Heather Mclean, female    DOB: 01-Nov-1981, 35 y.o.   MRN: [id number]  HPI 12/13/13- 32 yoFsmoker (1ppd/ 15 pk yr)-Self Referral for Cough and Wheezing. She says she has coughed and wheezed for years and accepts that smoking makes it worse. No prior evaluation. Cough worst in the morning and will sometimes cough until she retches. Boyfriend is smoker. Some wheeze, some shortness of breath, no regular exercise. No previous chest x-ray. Denies fever, blood, chest pain, swollen glands. Failed Chantix because of mood change.Has tried Vapes but prefers tobacco. Denies history of asthma, pneumonia, seasonal allergy. Admits reflux/heartburn but it does not wake her. No ENT surgery except wisdom teeth. 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 Mother with history of allergies.  05/13/14- 32 yoFsmoker (1ppd/ 15 pk yr)-followed for asthma/bronchitis, tobacco abuse, complicated by GERD FOLLOWS FOR: Pt states cough and wheezing are still the same; Review PFT results with patient. Still coughing some clear mucus. Still smokes. Occasional mild heartburn CXR 12/13/13 IMPRESSION:  Minimal peribronchial thickening and hyperinflation which could  reflect bronchitis or asthma.  No acute infiltrate.  Electronically Signed  By: Ulyses SouthwardMark Boles M.D.  On: 12/13/2013 13:25  PFT -05/13/14- minimal small airway obstructive disease with response to bronchodilator only in small airways. Normal diffusion, normal lung volumes. FVC 4.14/106%, FEV1 3.34/102%, FEV1/FVC 0.81, FEF 25-75% to 3.39/97% after dilator  05/05/2016-35 year old female smoker followed for asthma/bronchitis, tobacco abuse, complicated by GERD FOLLOW FOR:  chest tightness, sensitive to touch. coughing up white mucus. random heart palpitations.  She is anxious because of her smoking history and father's thoracotomy for lung cancer. 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 Sometimes feels a little chest  tightness and more aware of shortness of breath with exertion. Inhaler sample did help. Office spirometry 05/05/2016-within normal limits-FEV1/FVC 0.78  ROS-see HPI Constitutional:   No-   weight loss, night sweats, fevers, chills, fatigue, lassitude. HEENT:   No-  headaches, difficulty swallowing, tooth/dental problems, sore throat,       No-  sneezing, itching, ear ache, nasal congestion, post nasal drip,  CV:  No-   chest pain, orthopnea, PND, swelling in lower extremities, anasarca,                                                       dizziness, palpitations Resp: No-   shortness of breath with exertion or at rest.                 productive cough,  + non-productive cough,  No- coughing up of blood.              No-   change in color of mucus.  No- wheezing.   Skin: No-   rash or lesions. GI:  No-   heartburn, indigestion, abdominal pain, nausea, vomiting,  GU:  MS:  No-   joint pain or swelling.   Neuro-     nothing unusual Psych:  No- change in mood or affect. No depression or anxiety.  No memory loss.  Objective:   Physical Exam OBJ- Physical Exam General- Alert, Oriented, Affect-appropriate, Distress- none acute, WDWN Skin- rash-none, lesions- none, excoriation- none Lymphadenopathy- none Head- atraumatic            Eyes- Gross vision intact, PERRLA,  conjunctivae and secretions clear            Ears- Hearing, canals-normal            Nose- Clear, no-Septal dev, mucus, polyps, erosion, perforation             Throat- Mallampati II , mucosa +Red , drainage- none, tonsils- atrophic Neck- flexible , trachea midline, no stridor , thyroid nl, carotid no bruit Chest - symmetrical excursion , unlabored           Heart/CV- RRR , no murmur , no gallop  , no rub, nl s1 s2                           - JVD- none , edema- none, stasis changes- none, varices- none           Lung- clear to P&A, wheeze- none, cough+slight , dullness-none, rub- none           Chest wall-  Abd-  Br/ Gen/  Rectal- Not done, not indicated Extrem- cyanosis- none, clubbing, none, atrophy- none, strength- nl Neuro- grossly intact to observation  Assessment & Plan:

## 2016-05-05 NOTE — Patient Instructions (Signed)
Order- CXR    Dx tobacco user  Order- office spirometry      Script for albuterol rescue inhaler -    Try inhaling 2 puffs every 6 hours if needed   Please call as needed

## 2016-05-06 ENCOUNTER — Telehealth: Payer: Self-pay | Admitting: Internal Medicine

## 2016-05-06 NOTE — Telephone Encounter (Signed)
Pt calling again about xray results.Caren GriffinsStanley A Dalton

## 2016-05-06 NOTE — Telephone Encounter (Signed)
Result Notes     Notes Recorded by Waymon Budgelinton D Young, MD on 05/06/2016 at 9:45 AM CXR- clear and normal   Spoke with pt. She is aware of results. Nothing further was needed.

## 2016-05-12 ENCOUNTER — Encounter: Payer: Self-pay | Admitting: Internal Medicine

## 2016-05-12 NOTE — Assessment & Plan Note (Signed)
We discussed smoking cessation and encouraged her to consider a serious effort to stop

## 2016-05-12 NOTE — Assessment & Plan Note (Signed)
Office spirometry score surprisingly good. I explained that only means she hasn't sustained a lot of damage yet, but probably will if she doesn't stop smoking. Plan-chest x-ray, prescription for albuterol rescue inhaler for occasional use

## 2016-05-14 ENCOUNTER — Encounter: Payer: Self-pay | Admitting: Internal Medicine

## 2016-05-14 MED ORDER — ALBUTEROL SULFATE HFA 108 (90 BASE) MCG/ACT IN AERS: 2.0000 | INHALATION_SPRAY | Freq: Four times a day (QID) | RESPIRATORY_TRACT | Status: DC | PRN

## 2016-12-15 ENCOUNTER — Ambulatory Visit (INDEPENDENT_AMBULATORY_CARE_PROVIDER_SITE_OTHER): Payer: 59 | Admitting: Nurse Practitioner

## 2016-12-15 ENCOUNTER — Encounter: Payer: Self-pay | Admitting: Nurse Practitioner

## 2016-12-15 VITALS — BP 120/80 | HR 60 | Resp 14 | Ht 65.24 in | Wt 146.4 lb

## 2016-12-15 DIAGNOSIS — Z124 Encounter for screening for malignant neoplasm of cervix: Secondary | ICD-10-CM | POA: Diagnosis not present

## 2016-12-15 DIAGNOSIS — R8290 Unspecified abnormal findings in urine: Secondary | ICD-10-CM

## 2016-12-15 DIAGNOSIS — Z0000 Encounter for general adult medical examination without abnormal findings: Secondary | ICD-10-CM

## 2016-12-15 DIAGNOSIS — Z113 Encounter for screening for infections with a predominantly sexual mode of transmission: Secondary | ICD-10-CM | POA: Diagnosis not present

## 2016-12-15 DIAGNOSIS — Z01419 Encounter for gynecological examination (general) (routine) without abnormal findings: Secondary | ICD-10-CM | POA: Diagnosis not present

## 2016-12-15 LAB — POCT URINALYSIS DIPSTICK
Bilirubin, UA: NEGATIVE
Glucose, UA: NEGATIVE
Ketones, UA: NEGATIVE
Nitrite, UA: NEGATIVE
Protein, UA: NEGATIVE
Urobilinogen, UA: NEGATIVE
pH, UA: 5
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
## 2016-12-15 NOTE — Patient Instructions (Signed)

## 2016-12-16 LAB — GC/CHLAMYDIA PROBE AMP
CT Probe RNA: NOT DETECTED
GC Probe RNA: NOT DETECTED

## 2016-12-16 LAB — STD PANEL
HIV 1&2 Ab, 4th Generation: NONREACTIVE
Hepatitis B Surface Ag: NEGATIVE

## 2016-12-17 LAB — IPS PAP TEST WITH HPV

## 2016-12-17 LAB — URINE CULTURE

## 2016-12-19 NOTE — Progress Notes (Signed)
Encounter reviewed by Dr. Tyrese Capriotti Amundson C. Silva.  

## 2017-01-06 ENCOUNTER — Encounter: Payer: Self-pay | Admitting: Nurse Practitioner

## 2017-05-24 ENCOUNTER — Encounter: Payer: Self-pay | Admitting: Nurse Practitioner

## 2017-05-24 ENCOUNTER — Ambulatory Visit (INDEPENDENT_AMBULATORY_CARE_PROVIDER_SITE_OTHER): Payer: 59 | Admitting: Nurse Practitioner

## 2017-05-24 ENCOUNTER — Other Ambulatory Visit: Payer: Self-pay

## 2017-05-24 ENCOUNTER — Other Ambulatory Visit: Payer: Self-pay | Admitting: Nurse Practitioner

## 2017-05-24 ENCOUNTER — Telehealth: Payer: Self-pay | Admitting: Nurse Practitioner

## 2017-05-24 VITALS — BP 114/70 | HR 68 | Ht 65.25 in | Wt 149.0 lb

## 2017-05-24 DIAGNOSIS — N644 Mastodynia: Secondary | ICD-10-CM

## 2017-05-24 DIAGNOSIS — N949 Unspecified condition associated with female genital organs and menstrual cycle: Secondary | ICD-10-CM

## 2017-05-24 NOTE — Telephone Encounter (Signed)
Spoke with patient. Patient reports she has experienced a sharp pain through breast a couple of time the past couple of months. Reports mostly left breast and area of tenderness and soreness. Patient states not cycle related, no discharge, lumps, discoloration noted. Recommended OV for further evaluation, patient scheduled for today at 12:45pm with Ria CommentPatricia Grubb, NP. Patient is agreeable to date and time.  Routing to provider for final review. Patient is agreeable to disposition. Will close encounter.

## 2017-05-24 NOTE — Progress Notes (Signed)
Patient ID: Heather Mclean, female   DOB: 1981/06/07, 36 y.o.   MRN: [id number]  GYNECOLOGY  VISIT   HPI: 36 y.o.   Single  Caucasian  female   G2P0020 with Patient's last menstrual period was 05/15/2017 (exact date).   here for intermittent left breast pain since end of January.  Pain is intermittent lasting for fleeting seconds.  The most consistent area is at 9-10 o'clock position.  She finds no mass or change in breast character.  No FMH of breast cancer, OV, pancreatic.  There is stomach cancer with PGF and colon cancer MGM who passed at age 59.  She has been doing more weight lifting wit her regular exercise program - but only for just 3 weeks.  No other problems with menses and normally will get minimal breast tenderness.  Not dating or SA. She does describe a vaginal 'spasm' that occurred on Sunday.  Seemed to feel better when holding herself.  No urinary symptoms or bowel changes.  Not SA since March.  Denies vaginal discharge.  GYNECOLOGIC HISTORY: Patient's last menstrual period was 05/15/2017 (exact date). Contraception:abstinence Menopausal hormone therapy: N/A        OB History    Gravida Para Term Preterm AB Living   2 0     2 0   SAB TAB Ectopic Multiple Live Births                     Patient Active Problem List   Diagnosis Date Noted  . Asthmatic bronchitis , chronic (HCC) 01/06/2014  . GERD (gastroesophageal reflux disease) 01/06/2014  . TOBACCO USER 01/26/2010  . SINUSITIS- ACUTE-NOS 02/13/2008  . SORE THROAT 02/13/2008  . AMENORRHEA 12/14/2007    Past Medical History:  Diagnosis Date  . 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 AMENORRHEA   . Anxiety   . Depression   . HPV (human papilloma virus) anogenital infection Age 48  . SINUSITIS- ACUTE-NOS   . SORE THROAT   . TOBACCO USER     Past Surgical History:  Procedure Laterality Date  . COLPOSCOPY  Age 36  . LEEP  2001   CIN I, CIN II  . WISDOM TOOTH EXTRACTION  Age 60    No current  outpatient prescriptions on file.   No current facility-administered medications for this visit.      ALLERGIES: Patient has no known allergies.  Family History  Problem Relation Age of Onset  . Lung cancer Father 67  . Colon cancer Maternal Grandmother   . Cancer - Other Maternal Grandfather        stomache  . Leukemia Sister 69       CML  . Diabetes Paternal Grandmother     Social History   Social History  . Marital status: Single    Spouse name: N/A  . Number of children: N/A  . Years of education: N/A   Occupational History  . Not on file.   Social History Main Topics  . Smoking status: Current Every Day Smoker    Packs/day: 0.50    Years: 15.00    Types: Cigarettes  . Smokeless tobacco: Never Used  . Alcohol use 9.0 oz/week    15 Standard drinks or equivalent per week  . Drug use: No  . Sexual activity: Not Currently    Partners: Male    Birth control/ protection: None   Other Topics Concern  . Not on file  Social History Narrative  . No narrative on file    Review of Systems  Cardiovascular: Positive for chest pain.  Genitourinary:       Breast pain  All other systems reviewed and are negative.   PHYSICAL EXAMINATION:    BP 114/70 (BP Location: Right Arm, Patient Position: Sitting, Cuff Size: Normal)   Pulse 68   Ht 5' 5.25" (1.657 m)   Wt 149 lb (67.6 kg)   LMP 05/15/2017 (Exact Date)   BMI 24.61 kg/m     General appearance: alert, cooperative and appears stated age Neck: no adenopathy, supple, symmetrical, trachea midline and thyroid normal to inspection and palpation Breasts: normal appearance, no masses or tenderness, there are no masses. No lymphadenopathy.    The breast are very dense.  There is minimal pain at the 4th costal margin on the left.   No nipple discharge.   Abdomen: soft, non-tender; bowel sounds normal; no masses,  no organomegaly  Pelvic: External genitalia:  no lesions              Urethra:  normal appearing urethra  with no masses, tenderness or lesions              Bartholin's and Skene's: normal                 Vagina: normal appearing vagina with normal color and discharge, no lesions Affirm is done.              Cervix: anteverted and no lesions              Bimanual Exam:  Uterus:  normal size, contour, position, consistency, mobility, non-tender              Adnexa: no mass, fullness, tenderness             Chaperone was present for exam.  ASSESSMENT  Left Breast twinges of pain since January  Possible just costochondritis on the left 4 th rib space  R/O vaginal infection as the cause of vaginal spasm    PLAN  Will get diagnostic studies and follow for the breast - apt is 05/27/17 at 9:30 am  Will get Affirm and follow   An After Visit Summary was printed and given to the patient.

## 2017-05-24 NOTE — Telephone Encounter (Signed)
Patient is having "breast pain that feels like nerve pain". Patient is questioning if she needs an appointment?

## 2017-05-24 NOTE — Progress Notes (Signed)
Scheduled patient while in office for bilateral diagnostic mammogram with left breast ultrasound if needed on 05/27/2017 at 9:30 am. Patient is agreeable to date and time. Placed in mammogram hold.

## 2017-05-25 LAB — VAGINITIS/VAGINOSIS, DNA PROBE
Candida Species: NEGATIVE
Gardnerella vaginalis: NEGATIVE
Trichomonas vaginosis: NEGATIVE

## 2017-05-26 NOTE — Progress Notes (Signed)
Reviewed personally.  M. Suzanne Teniya Filter, MD.  

## 2017-05-27 ENCOUNTER — Ambulatory Visit
Admission: RE | Admit: 2017-05-27 | Discharge: 2017-05-27 | Disposition: A | Discharge status: Home / Self Care | Payer: 59 | Source: Ambulatory Visit | Attending: Nurse Practitioner | Admitting: Nurse Practitioner

## 2017-05-27 ENCOUNTER — Other Ambulatory Visit: Payer: Self-pay | Admitting: Nurse Practitioner

## 2017-05-27 DIAGNOSIS — N6310 Unspecified lump in the right breast, unspecified quadrant: Secondary | ICD-10-CM

## 2017-05-27 DIAGNOSIS — N63 Unspecified lump in unspecified breast: Secondary | ICD-10-CM

## 2017-05-27 DIAGNOSIS — R928 Other abnormal and inconclusive findings on diagnostic imaging of breast: Secondary | ICD-10-CM | POA: Diagnosis not present

## 2017-05-27 DIAGNOSIS — N644 Mastodynia: Secondary | ICD-10-CM

## 2017-10-04 ENCOUNTER — Other Ambulatory Visit: Payer: Self-pay | Admitting: Internal Medicine

## 2017-10-31 DIAGNOSIS — Z720 Tobacco use: Secondary | ICD-10-CM | POA: Diagnosis not present

## 2017-10-31 DIAGNOSIS — Z0000 Encounter for general adult medical examination without abnormal findings: Secondary | ICD-10-CM | POA: Diagnosis not present

## 2017-10-31 DIAGNOSIS — Z13220 Encounter for screening for lipoid disorders: Secondary | ICD-10-CM | POA: Diagnosis not present

## 2017-10-31 DIAGNOSIS — Z131 Encounter for screening for diabetes mellitus: Secondary | ICD-10-CM | POA: Diagnosis not present

## 2017-10-31 DIAGNOSIS — J029 Acute pharyngitis, unspecified: Secondary | ICD-10-CM | POA: Diagnosis not present

## 2017-10-31 DIAGNOSIS — K219 Gastro-esophageal reflux disease without esophagitis: Secondary | ICD-10-CM | POA: Diagnosis not present

## 2017-11-03 DIAGNOSIS — R140 Abdominal distension (gaseous): Secondary | ICD-10-CM | POA: Diagnosis not present

## 2017-11-03 DIAGNOSIS — K219 Gastro-esophageal reflux disease without esophagitis: Secondary | ICD-10-CM | POA: Diagnosis not present

## 2017-12-02 ENCOUNTER — Other Ambulatory Visit: Payer: 59

## 2017-12-02 ENCOUNTER — Ambulatory Visit
Admission: RE | Admit: 2017-12-02 | Discharge: 2017-12-02 | Disposition: A | Discharge status: Home / Self Care | Payer: 59 | Source: Ambulatory Visit | Attending: Nurse Practitioner | Admitting: Nurse Practitioner

## 2017-12-02 ENCOUNTER — Other Ambulatory Visit: Payer: Self-pay | Admitting: Obstetrics and Gynecology

## 2017-12-02 ENCOUNTER — Ambulatory Visit: Payer: 59

## 2017-12-02 DIAGNOSIS — R922 Inconclusive mammogram: Secondary | ICD-10-CM | POA: Diagnosis not present

## 2017-12-02 DIAGNOSIS — N6310 Unspecified lump in the right breast, unspecified quadrant: Secondary | ICD-10-CM

## 2017-12-02 DIAGNOSIS — N63 Unspecified lump in unspecified breast: Secondary | ICD-10-CM

## 2017-12-16 ENCOUNTER — Ambulatory Visit: Payer: 59 | Admitting: Obstetrics and Gynecology

## 2017-12-16 ENCOUNTER — Ambulatory Visit: Payer: 59 | Admitting: Nurse Practitioner

## 2018-01-10 IMAGING — DX DG CHEST 2V
2 series · 2 of 2 positions shown · non-contrast
Comparison: December 13, 2013.

CLINICAL DATA: Chest pain for 2 months.

EXAM:
CHEST  2 VIEW

[chest pa]
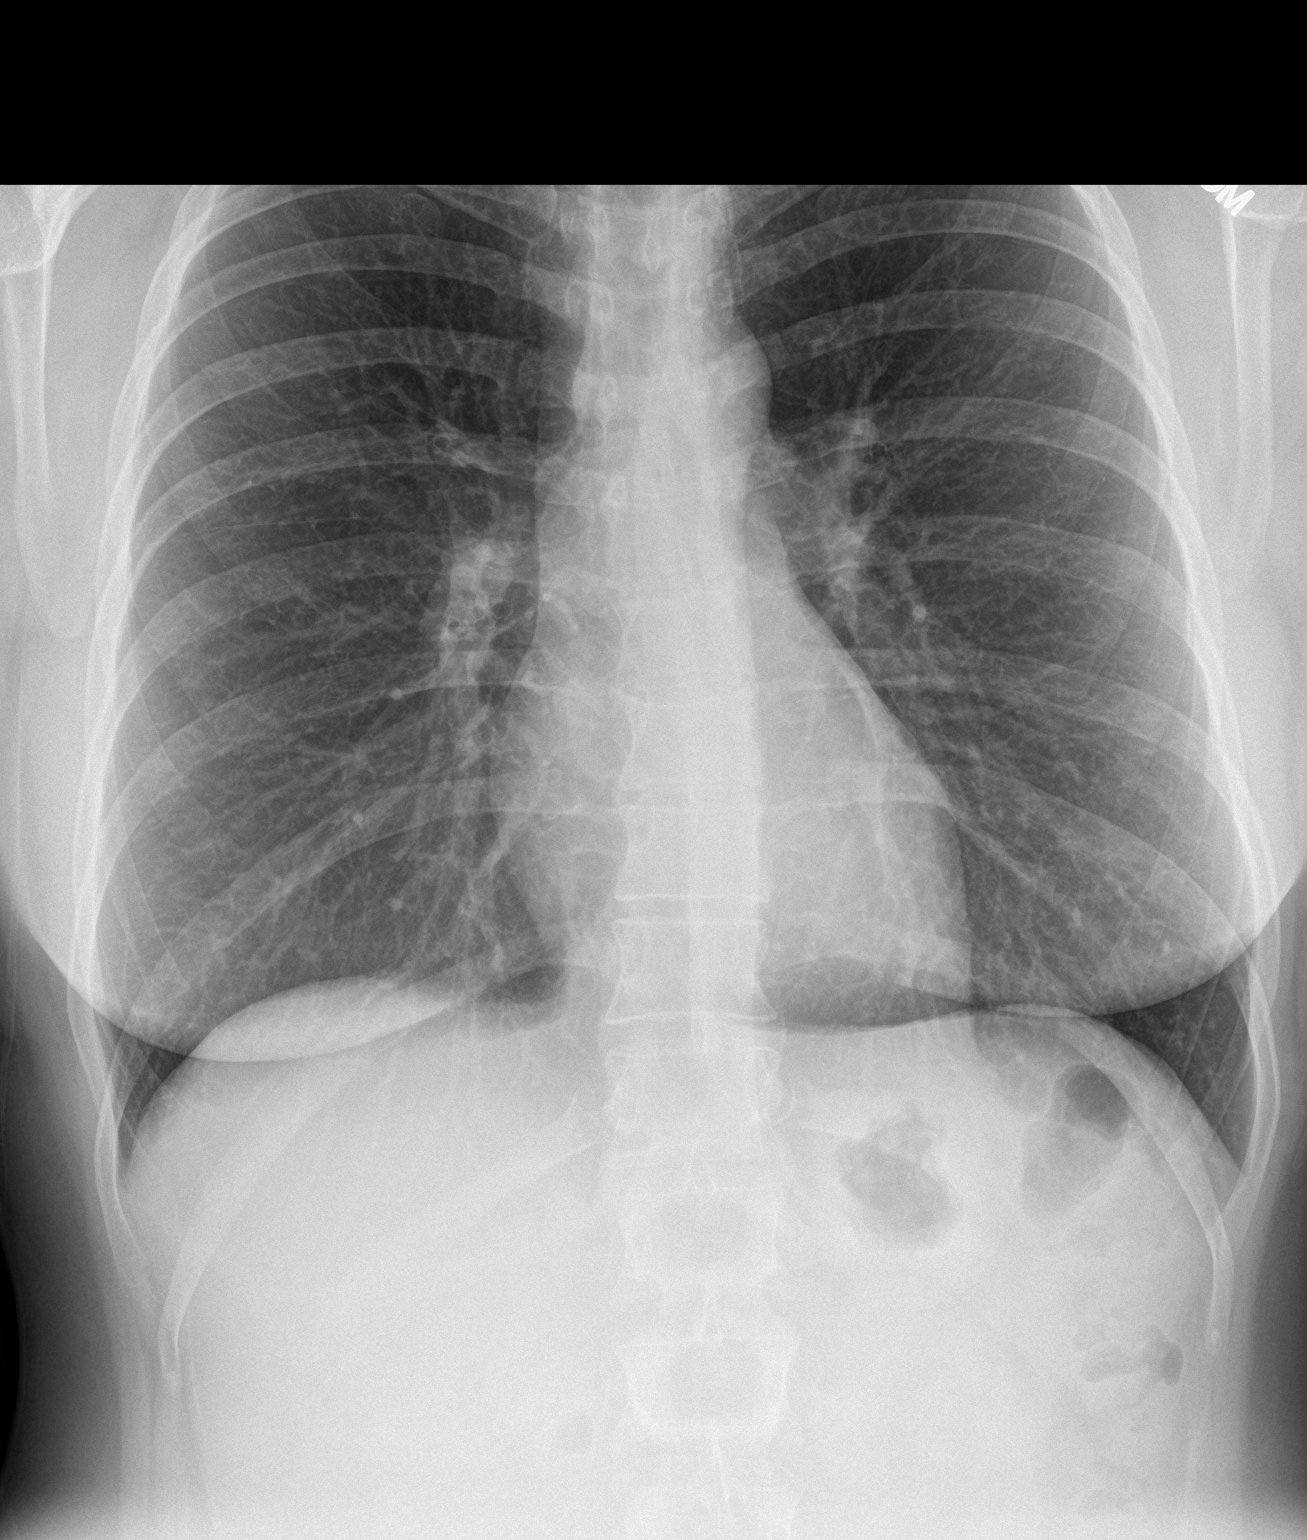

[chest lat]
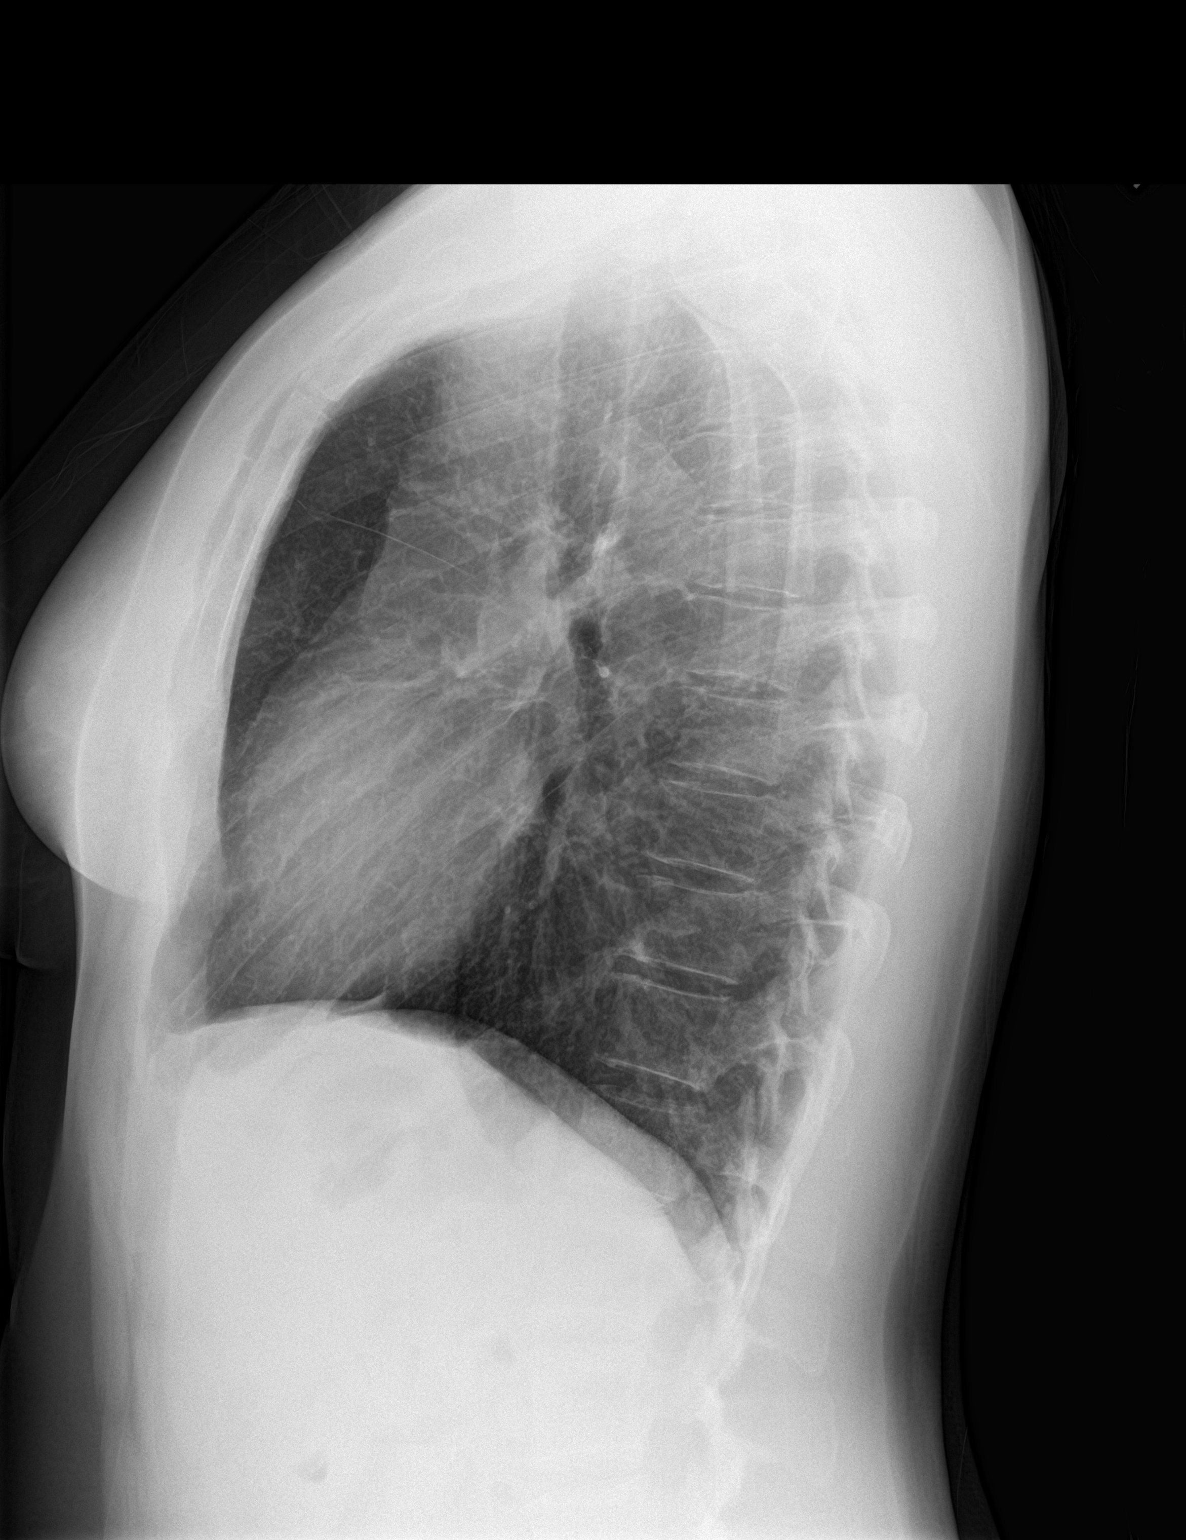

[2 of 2 positions shown; findings below may reference images not displayed]

FINDINGS: The heart size and mediastinal contours are within normal limits.
Both lungs are clear. No pneumothorax or pleural effusion is noted.
The visualized skeletal structures are unremarkable.
IMPRESSION: No active cardiopulmonary disease.

## 2018-03-23 ENCOUNTER — Telehealth: Payer: Self-pay | Admitting: Obstetrics and Gynecology

## 2018-03-23 ENCOUNTER — Ambulatory Visit: Payer: 59 | Admitting: Obstetrics and Gynecology

## 2018-03-23 NOTE — Telephone Encounter (Signed)
Patient called and cancelled her AEX with Dr. Edward JollySilva today because she said she started her menstrual cycle and that it's heavy. Patient rescheduled to Friday, 04/07/18.

## 2018-03-30 ENCOUNTER — Encounter: Payer: Self-pay | Admitting: Certified Nurse Midwife

## 2018-03-30 ENCOUNTER — Other Ambulatory Visit: Payer: Self-pay

## 2018-03-30 ENCOUNTER — Ambulatory Visit (INDEPENDENT_AMBULATORY_CARE_PROVIDER_SITE_OTHER): Payer: 59 | Admitting: Certified Nurse Midwife

## 2018-03-30 VITALS — BP 104/80 | Ht 65.25 in | Wt 144.0 lb

## 2018-03-30 DIAGNOSIS — Z01419 Encounter for gynecological examination (general) (routine) without abnormal findings: Secondary | ICD-10-CM

## 2018-03-30 DIAGNOSIS — K649 Unspecified hemorrhoids: Secondary | ICD-10-CM

## 2018-03-30 NOTE — Progress Notes (Signed)
37 y.o. [id number] Single  Caucasian Fe here for annual exam. Periods normal, no issues. Some changes with duration 2 days and heavy with cramping.Social stress with recent break in to her home. Now has security system and a dog. Contraception is none, but does not desire pregnancy or children. No STD concerns or screening needed. Partner of 5 years interested in vasectomy, request information. Still smoking, not interested in cessation. Has noted possible hemorrhoid in rectal area and request check in that area. No rectal bleeding, but discomfort with large stool at times. Eats well and drinks adequate water intake. Sees Urgent care if needed.  No other health issues today.  Patient's last menstrual period was 03/23/2018 (exact date).          Sexually active: Yes.    The current method of family planning is none.   Exercising: Yes.    walking Smoker:  yes  Health Maintenance: Pap:  12-03-14 neg, 12-15-16 neg HPV HR neg History of Abnormal Pap: yes hx of CINII MMG:  6/18 bilateral & rt breast u/s, also 12/18 rt breast u/s category c density birads 1:neg Self Breast exams: no Colonoscopy:  none BMD:   none TDaP:  2015 Shingles: no Pneumonia: no Hep C and HIV: HIV neg 2018 Labs: if needed   reports that she has been smoking cigarettes.  She has a 7.50 pack-year smoking history. She has never used smokeless tobacco. She reports that she drinks about 6.0 - 9.0 oz of alcohol per week. She reports that she does not use drugs.  Past Medical History:  Diagnosis Date  . 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 AMENORRHEA   . Anxiety   . Depression   . HPV (human papilloma virus) anogenital infection Age 37  . SINUSITIS- ACUTE-NOS   . SORE THROAT   . TOBACCO USER     Past Surgical History:  Procedure Laterality Date  . COLPOSCOPY  Age 37  . LEEP  2001   CIN I, CIN II  . WISDOM TOOTH EXTRACTION  Age 37    Current Outpatient Medications  Medication Sig Dispense Refill  . Multiple  Vitamins-Minerals (MULTIVITAMIN PO) Take by mouth.    Marland Kitchen. omeprazole (PRILOSEC) 40 MG capsule      No current facility-administered medications for this visit.     Family History  Problem Relation Age of Onset  . Lung cancer Father 1863  . Colon cancer Maternal Grandmother   . Cancer - Other Maternal Grandfather        stomache  . Leukemia Sister [phone]       CML  . Diabetes Paternal Grandmother     ROS:  Pertinent items are noted in HPI.  Otherwise, a comprehensive ROS was negative.  Exam:   BP 104/80   Ht 5' 5.25" (1.657 m)   Wt 144 lb (65.3 kg)   LMP 03/23/2018 (Exact Date)   BMI 23.78 kg/m  Height: 5' 5.25" (165.7 cm) Ht Readings from Last 3 Encounters:  03/30/18 5' 5.25" (1.657 m)  05/24/17 5' 5.25" (1.657 m)  12/15/16 5' 5.24" (1.657 m)    General appearance: alert, cooperative and appears stated age Head: Normocephalic, without obvious abnormality, atraumatic Neck: no adenopathy, supple, symmetrical, trachea midline and thyroid normal to inspection and palpation Lungs: clear to auscultation bilaterally Breasts: normal appearance, no masses or tenderness, No nipple retraction or dimpling, No nipple discharge or bleeding, No axillary or supraclavicular adenopathy Heart: regular rate and rhythm Abdomen:  soft, non-tender; no masses,  no organomegaly Extremities: extremities normal, atraumatic, no cyanosis or edema Skin: Skin color, texture, turgor normal. No rashes or lesions Lymph nodes: Cervical, supraclavicular, and axillary nodes normal. No abnormal inguinal nodes palpated Neurologic: Grossly normal   Pelvic: External genitalia:  no lesions              Urethra:  normal appearing urethra with no masses, tenderness or lesions              Bartholin's and Skene's: normal                 Vagina: normal appearing vagina with normal color and discharge, no lesions              Cervix: anteverted, no cervical motion tenderness, no lesions and nulliparous appearance               Pap taken: No. Bimanual Exam:  Uterus:  normal size, contour, position, consistency, mobility, non-tender and retroverted              Adnexa: normal adnexa and no mass, fullness, tenderness               Rectovaginal: Confirms               Anus:  normal sphincter tone, no lesions, small hemorrhoid noted, not thrombosed, non tender  Chaperone present: yes  A:  Well Woman with normal exam  Contraception none interested in vasectomy information  Non thrombosed small hemorrhoid  P:   Reviewed health and wellness pertinent to exam  Discussed option if sure no pregnancy plans, given [facility] information to contact for consultation information for partner.  Discussed hemorrhoid and etiology. Discussed good diet, Colace stool softener as needed and avoid straining. Report any bleeding or problems.  Pap smear: no   counseled on breast self exam, adequate intake of calcium and vitamin D, diet and exercise  return annually or prn  An After Visit Summary was printed and given to the patient.

## 2018-03-30 NOTE — Patient Instructions (Signed)

## 2018-04-07 ENCOUNTER — Ambulatory Visit: Payer: 59 | Admitting: Obstetrics and Gynecology

## 2018-04-10 ENCOUNTER — Telehealth: Payer: Self-pay | Admitting: Certified Nurse Midwife

## 2018-04-10 NOTE — Telephone Encounter (Signed)
Spoke with patient. Advised at her aex on 03/30/2018 pap was not needed and no labs were performed. Patient states she did not desire lab work. Advised as pap was normal in 2018 pap was not indicated this year. Patient verbalizes understanding.  Routing to provider for final review. Patient agreeable to disposition. Will close encounter.

## 2018-04-10 NOTE — Telephone Encounter (Signed)
Patient called requesting results from her last AEX.

## 2018-04-24 ENCOUNTER — Encounter: Payer: Self-pay | Admitting: Certified Nurse Midwife

## 2018-04-24 NOTE — Telephone Encounter (Signed)
Patient sent the following message through MyChart. Routing to triage to assist patient with request.   ----- Message from Mychart, Generic sent at 04/24/2018 1:27 PM EDT -----    Good afternoon [name] been noticing that typically around two weeks before my period I experience severe anxiety, mood swings, and depression. This last cycle was particularly bad. It is getting to the point where my relationships with friends and family and job performance are suffering. As soon as I start my period, which I did Sunday, the symptoms go away and I feel like myself again. I exercise regularly and eat a healthy diet. What are your thoughts on this situation?    Kind regards,    Heather Mclean

## 2018-04-25 ENCOUNTER — Telehealth: Payer: Self-pay | Admitting: *Deleted

## 2018-04-25 NOTE — Telephone Encounter (Signed)
See telephone encounter dated 04/25/18.  

## 2018-04-25 NOTE — Telephone Encounter (Signed)
Spoke with patient. Reports fatigue, mood changes, depression and anxiety 2 wks prior to start of menses. Once menses starts, symptoms resolve and don't start again until 2wks before menses. Noticed changes 3 months ago. Denies any other GYN symptoms, SI/HI,  no increased outside stresses. Feels these changes are affecting relationships with family and job.   Not on any current contraceptives. Cycles are regular.  Recommended OV for further evaluation. Patient request after 12:30 on a Friday. OV scheduled for 5/24 at 4pm with Leota Sauers, CNM.   Routing to provider for final review. Patient is agreeable to disposition. Will close encounter.

## 2018-04-28 ENCOUNTER — Ambulatory Visit: Payer: 59 | Admitting: Certified Nurse Midwife

## 2018-04-28 ENCOUNTER — Other Ambulatory Visit: Payer: Self-pay

## 2018-04-28 ENCOUNTER — Encounter: Payer: Self-pay | Admitting: Certified Nurse Midwife

## 2018-04-28 VITALS — BP 116/70 | HR 68 | Resp 14 | Ht 65.0 in | Wt 144.0 lb

## 2018-04-28 DIAGNOSIS — N943 Premenstrual tension syndrome: Secondary | ICD-10-CM | POA: Diagnosis not present

## 2018-04-28 DIAGNOSIS — Z30011 Encounter for initial prescription of contraceptive pills: Secondary | ICD-10-CM

## 2018-04-28 MED ORDER — NORETHINDRONE 0.35 MG PO TABS: 1.0000 | ORAL_TABLET | Freq: Every day | ORAL | 3 refills | Status: DC

## 2018-04-28 NOTE — Patient Instructions (Signed)
Premenstrual Syndrome Premenstrual syndrome (PMS) is a group of physical, emotional, and behavioral symptoms that affect women of childbearing age. PMS starts 1-2 weeks before the start of a woman's period and goes away a few days after the period starts. It often recurs in a predictable pattern. PMS can range from mild to severe. When it is severe, it is called premenstrual dysphoric disorder (PMDD). PMS can interfere in many ways with normal daily activities. What are the causes? The cause of this condition is not known, but it seems to be related to hormone changes that happen before menstruation. What are the signs or symptoms? Symptoms of this condition often happen every month. They go away completely after your period starts. Physical symptoms include:  Bloating.  Breast pain.  Headaches.  Extreme fatigue.  Backaches.  Swelling of the hands and feet.  Weight gain.  Hot flashes.  Emotional and behavioral symptoms include:  Mood swings.  Depression.  Angry outbursts.  Irritability.  Anxiety.  Crying spells.  Food cravings or appetite changes.  Changes in sexual desire.  Confusion.  Aggression.  Social withdrawal.  Poor concentration.  How is this diagnosed? This condition is diagnosed if symptoms of PMS:  Are present in the 5 days before your period starts.  End within 4 days after your period starts.  Happen at least 3 months in a row.  Interfere with some of your normal activities.  Other conditions that can cause some of these symptoms must be ruled out before PMS can be diagnosed. How is this treated? This condition may be treated by:  Maintaining a healthy lifestyle. This includes eating a balanced diet and exercising regularly.  Taking medicines. Medicines can help relieve symptoms such as cramps, aches, pains, headaches, and breast tenderness. Depending on the severity of the condition, your health care provider may  recommend: ? Over-the-counter pain medicines. ? Prescription medicines for PMDD.  Follow these instructions at home: Eating and drinking   Eat a well-balanced diet.  Avoid caffeine and alcohol.  Limit the amount of salt and salty foods you eat. This will help lessen bloating.  Drink enough fluid to keep your urine clear or pale yellow.  Take a multivitamin if told to by your health care provider. Lifestyle  Do not use any tobacco products, such as cigarettes, chewing tobacco, and e-cigarettes. If you need help quitting, ask your health care provider.  Exercise regularly as suggested by your health care provider.  Get enough sleep.  Practice relaxation techniques.  Limit stress. Other Instructions  For 2-3 months, write down your symptoms, their severity, and how long they last. This will help your health care provider choose the best treatment for you.  Take over-the-counter and prescription medicines only as told by your health care provider.  If you are using oral contraceptive pills, use them as told by your health care provider. This information is not intended to replace advice given to you by your health care provider. Make sure you discuss any questions you have with your health care provider. Document Released: 11/19/2000 Document Revised: 12/24/2015 Document Reviewed: 08/22/2015 Elsevier Interactive Patient Education  2018 Elsevier Inc.  

## 2018-04-28 NOTE — Progress Notes (Addendum)
  Subjective:     Patient ID: Heather Mclean, female   DOB: [birth year], 37 y.o.   MRN: [id number]  Patient here to discuss has noted 2 weeks prior to period she notes random crying and anxiety level increases and continues until period starts and then start to resolve. Last period occurrence was the worse ever now. "It is crazy, that this feels so much better when period starts". During that two period feels she can cope with , but feels if there is way to help with them. Patient has also been working on shaking off the stress with break in happening. Some bloating noted during this time and working on good diet with good choices. Patient is smoker 10 per day and is working on decrease. Patient also needs contraception and this would help with both things. No other health issues today.    Review of Systems Pertinent to HPI     Objective:   Physical Exam Not done consult only    Assessment:     PMS with mood and physical changes Contraception desired Smoker    Plan:     Discussed cycle control to help with mood changes with period as a option to try. She will have contraception, which will decrease worry ( using none) and will decrease period occurrence at times and will maintain stable hormone level throughout month. Patient has read about progesterone use for PMS and is glad this is an option. Risks/benefits and warning signs given. Will need to start on first day of next period and take continuously. Questions addressed. Usually has adjustment period of 3 months after onset. Discussed if thoughts of self harm or others to see 911 or ER. Patient aware, had use antidepressant before and does not desire again. Will advise of status after one month and plan OV at 3 months and prn. Patient agreeable. Rx Micronor see order with instructions.  Rv prn and as above  Time in consult regarding PMS/contraception. 28 minutes  Rv 3 months, prn

## 2018-06-26 ENCOUNTER — Encounter: Payer: Self-pay | Admitting: Certified Nurse Midwife

## 2018-07-04 ENCOUNTER — Telehealth: Payer: Self-pay | Admitting: Certified Nurse Midwife

## 2018-07-04 ENCOUNTER — Encounter (INDEPENDENT_AMBULATORY_CARE_PROVIDER_SITE_OTHER): Payer: Self-pay

## 2018-07-04 NOTE — Telephone Encounter (Signed)
Sent mychart message to patient

## 2018-07-04 NOTE — Telephone Encounter (Signed)
Forwarding to Deborah Leonard, CNM  

## 2018-07-04 NOTE — Telephone Encounter (Signed)
Message   ----- Message from Mychart, Generic sent at 07/04/2018 10:00 AM EDT -----    Carlton AdamHi Deborah,    Hope this message finds you well. Have you had any luck in finding a good fit for a General Practitioner?     Thank you for your time and effort.    Elease HashimotoPatricia  ----- Message -----  From: Leota Sauerseborah Leonard, CNM  Sent: 06/27/2018 5:10 PM EDT  To: Ronnald RampPatricia L Lacroix  Subject: RE: Non-Urgent Medical Question  Ewing SchleinHi Willisha,  I am looking into who would be a good fit for you and will advise.  Have a good evening  Debbi    ----- Message -----   From: Ronnald RampPatricia L Reber   Sent: 06/26/2018 12:44 PM EDT    To: Leota Sauerseborah Leonard, CNM  Subject: Non-Urgent Medical Question    I am searching for a different GP. Any recommendations?  Currently with [facility], however I feel like they just try to get me in-and-out. You always provide wonderful service so I trust your recommendations.

## 2018-07-13 ENCOUNTER — Telehealth: Payer: Self-pay | Admitting: Family Medicine

## 2018-07-13 NOTE — Telephone Encounter (Signed)
Ok. Thanks for asking. Please schedule 30 minute new pt appointment. Let her know I am here 3 days per week to make sure she is ok with that.

## 2018-07-13 NOTE — Telephone Encounter (Signed)
Copied from CRM [id number]. Topic: Appointment Scheduling - New Patient >> Jul 04, 2018 12:42 PM Waymon AmatoBurton, Donna F wrote: Pt is needing to know if Kriste BasqueHannah Kim will take her on as a new patient she was recommended by her OBGYN -Leota Sauerseborah Leonard at [facility]   Best number [phone] >> Jul 13, 2018  9:05 AM Darletta MollLander, Lumin L wrote: 08/16/2018 Status: Sch  Time: 8:00 AM    New patient. Mail PPW. Seeing Banks. >> Jul 13, 2018  9:28 AM Evee Liska, Amie CritchleySylvia A, RN wrote: Patient is on the schedule for 08/16/2018 to establish care with Dr. Salomon FickBanks, however would prefer to see Dr. Selena BattenKim. I will forward this message to Dr. Selena BattenKim for review.

## 2018-07-19 NOTE — Telephone Encounter (Signed)
Pt already is scheduled with Dr. Salomon FickBanks on 08/16/18.  I spoke with the pt she stated that it was a lot of confusion when she called in they stated that Dr. Selena BattenKim was taking newpts but she wasn't ready to make an appointment but will call back and then when she called again to make the appointment she was told that Dr. Selena BattenKim was not accepting newpts so she decided to go with Dr. Salomon FickBanks.

## 2018-08-16 ENCOUNTER — Ambulatory Visit: Payer: 59 | Admitting: Family Medicine

## 2018-08-16 ENCOUNTER — Encounter: Payer: Self-pay | Admitting: Family Medicine

## 2018-08-16 VITALS — BP 102/78 | HR 84 | Temp 98.9°F | Ht 65.0 in | Wt 149.0 lb

## 2018-08-16 DIAGNOSIS — Z7689 Persons encountering health services in other specified circumstances: Secondary | ICD-10-CM

## 2018-08-16 DIAGNOSIS — F17210 Nicotine dependence, cigarettes, uncomplicated: Secondary | ICD-10-CM | POA: Diagnosis not present

## 2018-08-16 DIAGNOSIS — K219 Gastro-esophageal reflux disease without esophagitis: Secondary | ICD-10-CM | POA: Diagnosis not present

## 2018-08-16 DIAGNOSIS — Z8659 Personal history of other mental and behavioral disorders: Secondary | ICD-10-CM | POA: Diagnosis not present

## 2018-08-16 NOTE — Progress Notes (Signed)
Patient presents to clinic today to establish care.  SUBJECTIVE: PMH: Pt is a 37 yo female with pmh sig for drug addction, depression, bulimia, GERD.  Pt was previously seen by [facility].  Pt also sees Jeanmarie Plant, [facility].  GERD: -taking omeprazole 40 mg daily  H/o Depression: -states was yrs ago in late teens -endorses good mood currently. -pt was in counseling.  Is considering restarting  H/o Bulimia: -no current issues  H/o drug addiction: -pt states no longer an issue.  Happened during her teens.  Allergies: NKDA  Social hx: Pt is single.  She has a master's degree and currently works as an Airline pilot.  Notes occasional poor posture at work which sometimes causes pain.  Pt endorses EtOH and tobacco use.  Pt endorses binge drinking 10 drinks per wk.  States this is an improvement.   Smoking 4 or more cigs per day.  States wears the nicotine patch at work.  In the past Chantix caused mood changes.  Pt denies drug use.  Family Medical Hx: Mom- alive, depression Dad-alive lung ca, depression, diabetes, heart dz, HLD, HTN Sister-Emily, alive, CML, depression, mental illness, stroke MGM-desc, arthritis, colon ca, depression MGF-desc, stomach PGM- alive, diabetes, hearing loss PGF- alive, lung ca, hearing loss   Health Maintenance: Dental -- Dr. Briscoe Deutscher III PAP -- 03/30/18 LMP--08/13/18  Past Medical History:  Diagnosis Date  . 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 AMENORRHEA   . Anxiety   . Depression   . HPV (human papilloma virus) anogenital infection Age 28  . SINUSITIS- ACUTE-NOS   . SORE THROAT   . TOBACCO USER     Past Surgical History:  Procedure Laterality Date  . COLPOSCOPY  Age 50  . LEEP  2001   CIN I, CIN II  . WISDOM TOOTH EXTRACTION  Age 71    Current Outpatient Medications on File Prior to Visit  Medication Sig Dispense Refill  . Multiple Vitamins-Minerals (MULTIVITAMIN PO) Take by mouth.    .  norethindrone (MICRONOR,CAMILA,ERRIN) 0.35 MG tablet Take 1 tablet (0.35 mg total) by mouth daily. 3 Package 3  . omeprazole (PRILOSEC) 40 MG capsule      No current facility-administered medications on file prior to visit.     No Known Allergies  Family History  Problem Relation Age of Onset  . Lung cancer Father 4  . Colon cancer Maternal Grandmother   . Cancer - Other Maternal Grandfather        stomache  . Leukemia Sister 28       CML  . Diabetes Paternal Grandmother     Social History   Socioeconomic History  . Marital status: Single    Spouse name: Not on file  . Number of children: Not on file  . Years of education: Not on file  . Highest education level: Not on file  Occupational History  . Not on file  Social Needs  . Financial resource strain: Not on file  . Food insecurity:    Worry: Not on file    Inability: Not on file  . Transportation needs:    Medical: Not on file    Non-medical: Not on file  Tobacco Use  . Smoking status: Current Every Day Smoker    Packs/day: 0.50    Years: 15.00    Pack years: 7.50    Types: Cigarettes  . Smokeless tobacco: Never Used  Substance and Sexual Activity  .  Alcohol use: Yes    Alcohol/week: 10.0 - 15.0 standard drinks    Types: 10 - 15 Standard drinks or equivalent per week  . Drug use: No  . Sexual activity: Yes    Partners: Male    Birth control/protection: None  Lifestyle  . Physical activity:    Days per week: Not on file    Minutes per session: Not on file  . Stress: Not on file  Relationships  . Social connections:    Talks on phone: Not on file    Gets together: Not on file    Attends religious service: Not on file    Active member of club or organization: Not on file    Attends meetings of clubs or organizations: Not on file    Relationship status: Not on file  . Intimate partner violence:    Fear of current or ex partner: Not on file    Emotionally abused: Not on file    Physically abused: Not  on file    Forced sexual activity: Not on file  Other Topics Concern  . Not on file  Social History Narrative  . Not on file    ROS General: Denies fever, chills, night sweats, changes in weight, changes in appetite HEENT: Denies headaches, ear pain, changes in vision, rhinorrhea, sore throat CV: Denies CP, palpitations, SOB, orthopnea Pulm: Denies SOB, cough, wheezing GI: Denies abdominal pain, nausea, vomiting, diarrhea, constipation  +GERD GU: Denies dysuria, hematuria, frequency, vaginal discharge  Msk: Denies muscle cramps, joint pains Neuro: Denies weakness, numbness, tingling Skin: Denies rashes, bruising Psych: Denies depression, anxiety, hallucinations  +h/o depression  BP 102/78 (BP Location: Left Arm, Patient Position: Sitting, Cuff Size: Normal)   Pulse 84   Temp 98.9 F (37.2 C) (Oral)   Ht 5\' 5"  (1.651 m)   Wt 149 lb (67.6 kg)   LMP 08/13/2018 (Exact Date)   SpO2 98%   BMI 24.79 kg/m   Physical Exam Gen. Pleasant, well developed, well-nourished, in NAD HEENT - [place]/AT, PERRL, no scleral icterus, no nasal drainage, pharynx without erythema or exudate.  TMs normal bilaterally.  No cervical lymphadenopathy. Lungs: no use of accessory muscles, CTAB, no wheezes, rales or rhonchi Cardiovascular: RRR,  No r/g/m, no peripheral edema Abdomen: BS present, soft, nontender, nondistended Musculoskeletal: No deformities, moves all four extremities, no cyanosis or clubbing, normal tone Neuro:  A&Ox3, CN II-XII intact, normal gait Skin:  Warm, dry, intact, no lesions  No results found for this or any previous visit (from the past 2160 hour(s)).  Assessment/Plan: Gastroesophageal reflux disease, esophagitis presence not specified -Discussed foods to avoid that are known to cause GERD symptoms -Continue omeprazole 40 mg daily -Given handouts  History of depression -stable -Patient considering counseling.  Given resources to the area providers encouraged to schedule her  own appointment.  Nicotine dependence -Cessation greater than 3 minutes, less than 10 minutes -Discussed cutting back while using the patch. -We will readdress at each visit.  Encounter to establish care -We reviewed the PMH, PSH, FH, SH, Meds and Allergies. -We provided refills for any medications we will prescribe as needed. -We addressed current concerns per orders and patient instructions. -We have asked for records for pertinent exams, studies, vaccines and notes from previous providers. -We have advised patient to follow up per instructions below.  F/u prn   Abbe Amsterdam, MD

## 2018-08-16 NOTE — Patient Instructions (Addendum)
Food Choices for Gastroesophageal Reflux Disease, Adult When you have gastroesophageal reflux disease (GERD), the foods you eat and your eating habits are very important. Choosing the right foods can help ease the discomfort of GERD. Consider working with a diet and nutrition specialist (dietitian) to help you make healthy food choices. What general guidelines should I follow? Eating plan  Choose healthy foods low in fat, such as fruits, vegetables, whole grains, low-fat dairy products, and lean meat, fish, and poultry.  Eat frequent, small meals instead of three large meals each day. Eat your meals slowly, in a relaxed setting. Avoid bending over or lying down until 2-3 hours after eating.  Limit high-fat foods such as fatty meats or fried foods.  Limit your intake of oils, butter, and shortening to less than 8 teaspoons each day.  Avoid the following: ? Foods that cause symptoms. These may be different for different people. Keep a food diary to keep track of foods that cause symptoms. ? Alcohol. ? Drinking large amounts of liquid with meals. ? Eating meals during the 2-3 hours before bed.  Cook foods using methods other than frying. This may include baking, grilling, or broiling. Lifestyle   Maintain a healthy weight. Ask your health care provider what weight is healthy for you. If you need to lose weight, work with your health care provider to do so safely.  Exercise for at least 30 minutes on 5 or more days each week, or as told by your health care provider.  Avoid wearing clothes that fit tightly around your waist and chest.  Do not use any products that contain nicotine or tobacco, such as cigarettes and e-cigarettes. If you need help quitting, ask your health care provider.  Sleep with the head of your bed raised. Use a wedge under the mattress or blocks under the bed frame to raise the head of the bed. What foods are not recommended? The items listed may not be a complete  list. Talk with your dietitian about what dietary choices are best for you. Grains Pastries or quick breads with added fat. [place] toast. Vegetables Deep fried vegetables. [place] fries. Any vegetables prepared with added fat. Any vegetables that cause symptoms. For some people this may include tomatoes and tomato products, chili peppers, onions and garlic, and horseradish. Fruits Any fruits prepared with added fat. Any fruits that cause symptoms. For some people this may include citrus fruits, such as oranges, grapefruit, pineapple, and lemons. Meats and other protein foods High-fat meats, such as fatty beef or pork, hot dogs, ribs, ham, sausage, salami and bacon. Fried meat or protein, including fried fish and fried chicken. Nuts and nut butters. Dairy Whole milk and chocolate milk. Sour cream. Cream. Ice cream. Cream cheese. Milk shakes. Beverages Coffee and tea, with or without caffeine. Carbonated beverages. Sodas. Energy drinks. Fruit juice made with acidic fruits (such as orange or grapefruit). Tomato juice. Alcoholic drinks. Fats and oils Butter. Margarine. Shortening. Ghee. Sweets and desserts Chocolate and cocoa. Donuts. Seasoning and other foods Pepper. Peppermint and spearmint. Any condiments, herbs, or seasonings that cause symptoms. For some people, this may include curry, hot sauce, or vinegar-based salad dressings. Summary  When you have gastroesophageal reflux disease (GERD), food and lifestyle choices are very important to help ease the discomfort of GERD.  Eat frequent, small meals instead of three large meals each day. Eat your meals slowly, in a relaxed setting. Avoid bending over or lying down until 2-3 hours after eating.  Limit high-fat  foods such as fatty meat or fried foods. This information is not intended to replace advice given to you by your health care provider. Make sure you discuss any questions you have with your health care provider. Document Released:  11/22/2005 Document Revised: 11/23/2016 Document Reviewed: 11/23/2016 Elsevier Interactive Patient Education  2018 [employer].  Back Pain, Adult Many adults have back pain from time to time. Common causes of back pain include:  A strained muscle or ligament.  Wear and tear (degeneration) of the spinal disks.  Arthritis.  A hit to the back.  Back pain can be short-lived (acute) or last a long time (chronic). A physical exam, lab tests, and imaging studies may be done to find the cause of your pain. Follow these instructions at home: Managing pain and stiffness  Take over-the-counter and prescription medicines only as told by your health care provider.  If directed, apply heat to the affected area as often as told by your health care provider. Use the heat source that your health care provider recommends, such as a moist heat pack or a heating pad. ? Place a towel between your skin and the heat source. ? Leave the heat on for 20-30 minutes. ? Remove the heat if your skin turns bright red. This is especially important if you are unable to feel pain, heat, or cold. You have a greater risk of getting burned.  If directed, apply ice to the injured area: ? Put ice in a plastic bag. ? Place a towel between your skin and the bag. ? Leave the ice on for 20 minutes, 2-3 times a day for the first 2-3 days. Activity  Do not stay in bed. Resting more than 1-2 days can delay your recovery.  Take short walks on even surfaces as soon as you are able. Try to increase the length of time you walk each day.  Do not sit, drive, or stand in one place for more than 30 minutes at a time. Sitting or standing for long periods of time can put stress on your back.  Use proper lifting techniques. When you bend and lift, use positions that put less stress on your back: ? [place] your knees. ? Keep the load close to your body. ? Avoid twisting.  Exercise regularly as told by your health care provider.  Exercising will help your back heal faster. This also helps prevent back injuries by keeping muscles strong and flexible.  Your health care provider may recommend that you see a physical therapist. This person can help you come up with a safe exercise program. Do any exercises as told by your physical therapist. Lifestyle  Maintain a healthy weight. Extra weight puts stress on your back and makes it difficult to have good posture.  Avoid activities or situations that make you feel anxious or stressed. Learn ways to manage anxiety and stress. One way to manage stress is through exercise. Stress and anxiety increase muscle tension and can make back pain worse. General instructions  Sleep on a firm mattress in a comfortable position. Try lying on your side with your knees slightly bent. If you lie on your back, put a pillow under your knees.  Follow your treatment plan as told by your health care provider. This may include: ? Cognitive or behavioral therapy. ? Acupuncture or massage therapy. ? Meditation or yoga. Contact a health care provider if:  You have pain that is not relieved with rest or medicine.  You have increasing pain going  down into your legs or buttocks.  Your pain does not improve in 2 weeks.  You have pain at night.  You lose weight.  You have a fever or chills. Get help right away if:  You develop new bowel or bladder control problems.  You have unusual weakness or numbness in your arms or legs.  You develop nausea or vomiting.  You develop abdominal pain.  You feel faint. Summary  Many adults have back pain from time to time. A physical exam, lab tests, and imaging studies may be done to find the cause of your pain.  Use proper lifting techniques. When you bend and lift, use positions that put less stress on your back.  Take over-the-counter and prescription medicines and apply heat or ice as directed by your health care provider. This information is not  intended to replace advice given to you by your health care provider. Make sure you discuss any questions you have with your health care provider. Document Released: 11/22/2005 Document Revised: 12/27/2016 Document Reviewed: 12/27/2016 Elsevier Interactive Patient Education  [employer].

## 2018-08-18 ENCOUNTER — Encounter: Payer: Self-pay | Admitting: Family Medicine

## 2018-08-18 DIAGNOSIS — Z8659 Personal history of other mental and behavioral disorders: Secondary | ICD-10-CM | POA: Insufficient documentation

## 2018-08-18 DIAGNOSIS — F17210 Nicotine dependence, cigarettes, uncomplicated: Secondary | ICD-10-CM | POA: Insufficient documentation

## 2018-08-22 ENCOUNTER — Other Ambulatory Visit: Payer: Self-pay

## 2018-08-22 ENCOUNTER — Ambulatory Visit (INDEPENDENT_AMBULATORY_CARE_PROVIDER_SITE_OTHER): Payer: 59 | Admitting: Certified Nurse Midwife

## 2018-08-22 ENCOUNTER — Encounter: Payer: Self-pay | Admitting: Certified Nurse Midwife

## 2018-08-22 VITALS — BP 102/68 | HR 68 | Resp 16 | Ht 65.0 in | Wt 149.0 lb

## 2018-08-22 DIAGNOSIS — N943 Premenstrual tension syndrome: Secondary | ICD-10-CM

## 2018-08-22 DIAGNOSIS — Z3041 Encounter for surveillance of contraceptive pills: Secondary | ICD-10-CM | POA: Diagnosis not present

## 2018-08-22 MED ORDER — NORETHINDRONE 0.35 MG PO TABS: 1.0000 | ORAL_TABLET | Freq: Every day | ORAL | 2 refills | Status: DC

## 2018-08-22 NOTE — Progress Notes (Signed)
Review of Systems  Constitutional: Negative.   HENT: Negative.   Eyes: Negative.   Respiratory: Negative.   Cardiovascular: Negative.   Genitourinary: Negative.   Skin: Negative.   Neurological:       PMS is much better with Progesterone    36 y.o. Married Caucasian female [id number] here for follow up of contraception and  PMS treated with Camilla initiated on 04/28/18. Patient taking medication daily and instructed, with no missed pills. Period scant to none. Feelings of PMS, anxiety are decreasing. She is also trying to exercise if possible and walk her dog( her Happy time). Patient is feeling overall much better since starting on POP. Denies any warning signs with use. Has gained 5 pounds, but not sure if medication or her diet change. Continues to work on [employer] diet and spouse supportive. Would like to continue. Questions addressed.   O: Healthy WD,WN female, appropriately dressed Affect: normal  A: Normal contraception follow up Camilla  PMS has decreased with use Seeking counselor to help with her stress levels related to busy job   P: Discussed feel this was a good choice for her with POP use and PMS decreased. Reviewed warning signs with use and bleeding expectation. Stressed daily use for contraception and cycle management. Rx Camilla see order with instructions Encouraged to move forward with counselor as discussed.  Rv prn, aex   18 minutes in discussion of PMS and current POP use.

## 2018-10-30 ENCOUNTER — Other Ambulatory Visit: Payer: Self-pay | Admitting: Family Medicine

## 2018-11-06 ENCOUNTER — Ambulatory Visit (INDEPENDENT_AMBULATORY_CARE_PROVIDER_SITE_OTHER): Payer: 59 | Admitting: Family Medicine

## 2018-11-06 ENCOUNTER — Encounter: Payer: Self-pay | Admitting: Family Medicine

## 2018-11-06 VITALS — BP 98/82 | HR 68 | Temp 98.5°F | Ht 65.0 in | Wt 153.0 lb

## 2018-11-06 DIAGNOSIS — F1020 Alcohol dependence, uncomplicated: Secondary | ICD-10-CM

## 2018-11-06 DIAGNOSIS — Z0000 Encounter for general adult medical examination without abnormal findings: Secondary | ICD-10-CM

## 2018-11-06 DIAGNOSIS — Z13220 Encounter for screening for lipoid disorders: Secondary | ICD-10-CM

## 2018-11-06 DIAGNOSIS — Z131 Encounter for screening for diabetes mellitus: Secondary | ICD-10-CM | POA: Diagnosis not present

## 2018-11-06 LAB — CBC WITH DIFFERENTIAL/PLATELET
Basophils Absolute: 0 10*3/uL (ref 0.0–0.1)
Basophils Relative: 0.2 % (ref 0.0–3.0)
Eosinophils Absolute: 0.1 10*3/uL (ref 0.0–0.7)
Eosinophils Relative: 0.7 % (ref 0.0–5.0)
HCT: 43 % (ref 36.0–46.0)
Hemoglobin: 14.5 g/dL (ref 12.0–15.0)
Lymphocytes Relative: 16.2 % (ref 12.0–46.0)
Lymphs Abs: 1.5 10*3/uL (ref 0.7–4.0)
MCHC: 33.7 g/dL (ref 30.0–36.0)
MCV: 93.5 fl (ref 78.0–100.0)
Monocytes Absolute: 0.6 10*3/uL (ref 0.1–1.0)
Monocytes Relative: 6.6 % (ref 3.0–12.0)
Neutro Abs: 7.1 10*3/uL (ref 1.4–7.7)
Neutrophils Relative %: 76.3 % (ref 43.0–77.0)
Platelets: 274 10*3/uL (ref 150.0–400.0)
RBC: 4.6 Mil/uL (ref 3.87–5.11)
RDW: 13.5 % (ref 11.5–15.5)
WBC: 9.3 10*3/uL (ref 4.0–10.5)

## 2018-11-06 LAB — COMPREHENSIVE METABOLIC PANEL
ALT: 12 U/L (ref 0–35)
AST: 21 U/L (ref 0–37)
Albumin: 4.4 g/dL (ref 3.5–5.2)
Alkaline Phosphatase: 48 U/L (ref 39–117)
BUN: 10 mg/dL (ref 6–23)
CO2: 28 mEq/L (ref 19–32)
Calcium: 9.6 mg/dL (ref 8.4–10.5)
Chloride: 102 mEq/L (ref 96–112)
Creatinine, Ser: 0.64 mg/dL (ref 0.40–1.20)
GFR: 111.51 mL/min (ref >60.00)
Glucose, Bld: 95 mg/dL (ref 70–99)
Potassium: 4.7 mEq/L (ref 3.5–5.1)
Sodium: 138 mEq/L (ref 135–145)
Total Bilirubin: 0.5 mg/dL (ref 0.2–1.2)
Total Protein: 6.9 g/dL (ref 6.0–8.3)

## 2018-11-06 LAB — LIPID PANEL
Cholesterol: 186 mg/dL (ref 0–200)
HDL: 63.8 mg/dL (ref >39.00)
LDL Cholesterol: 89 mg/dL (ref 0–99)
NonHDL: 122.48
Total CHOL/HDL Ratio: 3
Triglycerides: 167 mg/dL — ABNORMAL HIGH (ref 0.0–149.0)
VLDL: 33.4 mg/dL (ref 0.0–40.0)

## 2018-11-06 LAB — HEMOGLOBIN A1C: Hgb A1c MFr Bld: 5.2 % (ref 4.6–6.5)

## 2018-11-06 MED ORDER — OMEPRAZOLE 40 MG PO CPDR: DELAYED_RELEASE_CAPSULE | ORAL | 3 refills | Status: DC

## 2018-11-06 NOTE — Patient Instructions (Signed)
Preventive Care 18-39 Years, Female Preventive care refers to lifestyle choices and visits with your health care provider that can promote health and wellness. What does preventive care include?  A yearly physical exam. This is also called an annual well check.  Dental exams once or twice a year.  Routine eye exams. Ask your health care provider how often you should have your eyes checked.  Personal lifestyle choices, including: ? Daily care of your teeth and gums. ? Regular physical activity. ? Eating a healthy diet. ? Avoiding tobacco and drug use. ? Limiting alcohol use. ? Practicing safe sex. ? Taking vitamin and mineral supplements as recommended by your health care provider. What happens during an annual well check? The services and screenings done by your health care provider during your annual well check will depend on your age, overall health, lifestyle risk factors, and family history of disease. Counseling Your health care provider may ask you questions about your:  Alcohol use.  Tobacco use.  Drug use.  Emotional well-being.  Home and relationship well-being.  Sexual activity.  Eating habits.  Work and work Statistician.  Method of birth control.  Menstrual cycle.  Pregnancy history.  Screening You may have the following tests or measurements:  Height, weight, and BMI.  Diabetes screening. This is done by checking your blood sugar (glucose) after you have not eaten for a while (fasting).  Blood pressure.  Lipid and cholesterol levels. These may be checked every 5 years starting at age 66.  Skin check.  Hepatitis C blood test.  Hepatitis B blood test.  Sexually transmitted disease (STD) testing.  BRCA-related cancer screening. This may be done if you have a family history of breast, ovarian, tubal, or peritoneal cancers.  Pelvic exam and Pap test. This may be done every 3 years starting at age 40. Starting at age 59, this may be done every 5  years if you have a Pap test in combination with an HPV test.  Discuss your test results, treatment options, and if necessary, the need for more tests with your health care provider. Vaccines Your health care provider may recommend certain vaccines, such as:  Influenza vaccine. This is recommended every year.  Tetanus, diphtheria, and acellular pertussis (Tdap, Td) vaccine. You may need a Td booster every 10 years.  Varicella vaccine. You may need this if you have not been vaccinated.  HPV vaccine. If you are 69 or younger, you may need three doses over 6 months.  Measles, mumps, and rubella (MMR) vaccine. You may need at least one dose of MMR. You may also need a second dose.  Pneumococcal 13-valent conjugate (PCV13) vaccine. You may need this if you have certain conditions and were not previously vaccinated.  Pneumococcal polysaccharide (PPSV23) vaccine. You may need one or two doses if you smoke cigarettes or if you have certain conditions.  Meningococcal vaccine. One dose is recommended if you are age 27-21 years and a first-year college student living in a residence hall, or if you have one of several medical conditions. You may also need additional booster doses.  Hepatitis A vaccine. You may need this if you have certain conditions or if you travel or work in places where you may be exposed to hepatitis A.  Hepatitis B vaccine. You may need this if you have certain conditions or if you travel or work in places where you may be exposed to hepatitis B.  Haemophilus influenzae type b (Hib) vaccine. You may need this if  you have certain risk factors.  Talk to your health care provider about which screenings and vaccines you need and how often you need them. This information is not intended to replace advice given to you by your health care provider. Make sure you discuss any questions you have with your health care provider. Document Released: 01/18/2002 Document Revised: 08/11/2016  Document Reviewed: 09/23/2015 Elsevier Interactive Patient Education  2018 [place].  Heart Disease Prevention Heart disease is a leading cause of death. There are many things you can do to help prevent heart disease. Be physically active Physical activity is good for your heart. It helps control your blood pressure, cholesterol levels, and weight. Try to be physically active every day. Ask your health care provider what activities are best for you. Be a healthy weight Extra weight can strain your heart and affect your blood pressure and cholesterol levels. Lose weight with diet and exercise if recommended by your health care provider. Eat heart-healthy foods Follow a healthy eating plan as recommended by your health care provider or dietitian. Heart-healthy foods include:  High-fiber foods. These include oat bran, oatmeal, and whole-grain breads and cereals.  Fruits and vegetables.  Avoid:  Alcohol.  Fried foods.  Foods high in saturated fat. These include meats, butter, whole dairy products, shortening, and coconut or palm oil.  Salty foods. These include canned food, luncheon meat, salty snacks, and fast food.  Keep your cholesterol levels under control Cholesterol is a substance that is used for many important functions. When your cholesterol levels are high, cholesterol can stick to the insides of your blood vessels, making them narrow or clog. This can lead to chest pain (angina) and a heart attack. Keep your cholesterol levels under control as recommended by your health care provider. Have your cholesterol checked at least once a year. Target cholesterol levels (in mg/dL) for most people are:  Total cholesterol below 200.  LDL cholesterol below 100.  HDL cholesterol above 40 in men and above 50 in women.  Triglycerides below 150.  Keep your blood pressure under control Having high blood pressure (hypertension) puts you at risk for stroke and other forms of heart  disease. Keep your blood pressure under control as recommended by your health care provider. Ask your health care provider if you need treatment to lower your blood pressure. If you are 68-47 years of age, have your blood pressure checked every 3-5 years. If you are 68 years of age or older, have your blood pressure checked every year. Do not use tobacco products Tobacco smoke can damage your heart and blood vessels. Do not use any tobacco products including cigarettes, chewing tobacco, or electronic cigarettes. If you need help quitting, ask your health care provider. Take medicines as directed Take medicines only as directed by your health care provider. Ask your health care provider whether you should take an aspirin every day. Taking aspirin can help reduce your risk of heart disease and stroke. Where to find more information: To find out more about heart disease, visit the American Heart Association's website at www.americanheart.org This information is not intended to replace advice given to you by your health care provider. Make sure you discuss any questions you have with your health care provider. Document Released: 07/06/2004 Document Revised: 04/21/2016 Document Reviewed: 01/16/2014 Elsevier Interactive Patient Education  2018 [employer].  Alcohol Use Disorder Alcohol use disorder is when your drinking disrupts your daily life. When you have this condition, you drink too much alcohol and  you cannot control your drinking. Alcohol use disorder can cause serious problems with your physical health. It can affect your brain, heart, liver, pancreas, immune system, stomach, and intestines. Alcohol use disorder can increase your risk for certain cancers and cause problems with your mental health, such as depression, anxiety, psychosis, delirium, and dementia. People with this disorder risk hurting themselves and others. What are the causes? This condition is caused by drinking too much alcohol  over time. It is not caused by drinking too much alcohol only one or two times. Some people with this condition drink alcohol to cope with or escape from negative life events. Others drink to relieve pain or symptoms of mental illness. What increases the risk? You are more likely to develop this condition if:  You have a family history of alcohol use disorder.  Your culture encourages drinking to the point of intoxication, or makes alcohol easy to get.  You had a mood or conduct disorder in childhood.  You have been a victim of abuse.  You are an adolescent and: ? You have poor grades or difficulties in school. ? Your caregivers do not talk to you about saying no to alcohol, or supervise your activities. ? You are impulsive or you have trouble with self-control.  What are the signs or symptoms? Symptoms of this condition include:  Drinkingmore than you want to.  Drinking for longer than you want to.  Trying several times to drink less or to control your drinking.  Spending a lot of time getting alcohol, drinking, or recovering from drinking.  Craving alcohol.  Having problems at work, at school, or at home due to drinking.  Having problems in relationships due to drinking.  Drinking when it is dangerous to drink, such as before driving a car.  Continuing to drink even though you know you might have a physical or mental problem related to drinking.  Needing more and more alcohol to get the same effect you want from the alcohol (building up tolerance).  Having symptoms of withdrawal when you stop drinking. Symptoms of withdrawal include: ? Fatigue. ? Nightmares. ? Trouble sleeping. ? Depression. ? Anxiety. ? Fever. ? Seizures. ? Severe confusion. ? Feeling or seeing things that are not there (hallucinations). ? Tremors. ? Rapid heart rate. ? Rapid breathing. ? High blood pressure.  Drinking to avoid symptoms of withdrawal.  How is this diagnosed? This  condition is diagnosed with an assessment. Your health care provider may start the assessment by asking three or four questions about your drinking. Your health care provider may perform a physical exam or do lab tests to see if you have physical problems resulting from alcohol use. She or he may refer you to a mental health professional for evaluation. How is this treated? Some people with alcohol use disorder are able to reduce their alcohol use to low-risk levels. Others need to completely quit drinking alcohol. When necessary, mental health professionals with specialized training in substance use treatment can help. Your health care provider can help you decide how severe your alcohol use disorder is and what type of treatment you need. The following forms of treatment are available:  Detoxification. Detoxification involves quitting drinking and using prescription medicines within the first week to help lessen withdrawal symptoms. This treatment is important for people who have had withdrawal symptoms before and for heavy drinkers who are likely to have withdrawal symptoms. Alcohol withdrawal can be dangerous, and in severe cases, it can cause death. Detoxification may  be provided in a home, community, or primary care setting, or in a hospital or substance use treatment facility.  Counseling. This treatment is also called talk therapy. It is provided by substance use treatment counselors. A counselor can address the reasons you use alcohol and suggest ways to keep you from drinking again or to prevent problem drinking. The goals of talk therapy are to: ? Find healthy activities and ways for you to cope with stress. ? Identify and avoid the things that trigger your alcohol use. ? Help you learn how to handle cravings.  Medicines.Medicines can help treat alcohol use disorder by: ? Decreasing alcohol cravings. ? Decreasing the positive feeling you have when you drink alcohol. ? Causing an  uncomfortable physical reaction when you drink alcohol (aversion therapy).  Support groups. Support groups are led by people who have quit drinking. They provide emotional support, advice, and guidance.  These forms of treatment are often combined. Some people with this condition benefit from a combination of treatments provided by specialized substance use treatment centers. Follow these instructions at home:  Take over-the-counter and prescription medicines only as told by your health care provider.  Check with your health care provider before starting any new medicines.  Ask friends and family members not to offer you alcohol.  Avoid situations where alcohol is served, including gatherings where others are drinking alcohol.  Create a plan for what to do when you are tempted to use alcohol.  Find hobbies or activities that you enjoy that do not include alcohol.  Keep all follow-up visits as told by your health care provider. This is important. How is this prevented?  If you drink, limit alcohol intake to no more than 1 drink a day for nonpregnant women and 2 drinks a day for men. One drink equals 12 oz of beer, 5 oz of wine, or 1 oz of hard liquor.  If you have a mental health condition, get treatment and support.  Do not give alcohol to adolescents.  If you are an adolescent: ? Do not drink alcohol. ? Do not be afraid to say no if someone offers you alcohol. Speak up about why you do not want to drink. You can be a positive role model for your friends and set a good example for those around you by not drinking alcohol. ? If your friends drink, spend time with others who do not drink alcohol. Make new friends who do not use alcohol. ? Find healthy ways to manage stress and emotions, such as meditation or deep breathing, exercise, spending time in nature, listening to music, or talking with a trusted friend or family member. Contact a health care provider if:  You are not able to  take your medicines as told.  Your symptoms get worse.  You return to drinking alcohol (relapse) and your symptoms get worse. Get help right away if:  You have thoughts about hurting yourself or others. If you ever feel like you may hurt yourself or others, or have thoughts about taking your own life, get help right away. You can go to your nearest emergency department or call:  Your local emergency services (911 in the [place]  A suicide crisis helpline, such as the [facility] at (272)625-3003. This is open 24 hours a day.  Summary  Alcohol use disorder is when your drinking disrupts your daily life. When you have this condition, you drink too much alcohol and you cannot control your drinking.  Treatment may  include detoxification, counseling, medicine, and support groups.  Ask friends and family members not to offer you alcohol. Avoid situations where alcohol is served.  Get help right away if you have thoughts about hurting yourself or others. This information is not intended to replace advice given to you by your health care provider. Make sure you discuss any questions you have with your health care provider. Document Released: 12/30/2004 Document Revised: 08/19/2016 Document Reviewed: 08/19/2016 Elsevier Interactive Patient Education  [employer].

## 2018-11-06 NOTE — Progress Notes (Signed)
Subjective:     Heather Mclean is a 37 y.o. female and is here for a comprehensive physical exam. The patient reports no problems.  Pt followed by OB/Gyn, seen 03/30/18, no pap obtained at that visit.  Pt states she is doing well.  Pt is having 20 drinks/wk or beer or wine.  Pt would like her liver checked and screening for DM as it "runs in the family".  Pt smoking a few cigarettes per day.  Pt also mentions an episode of feeling cold/clamy that only last a few secs-mins at most.  Pt has had this happen in the past.  Currently feels ok, but wonders if she has DM.    Social History   Socioeconomic History  . Marital status: Single    Spouse name: Not on file  . Number of children: Not on file  . Years of education: Not on file  . Highest education level: Not on file  Occupational History  . Not on file  Social Needs  . Financial resource strain: Not on file  . Food insecurity:    Worry: Not on file    Inability: Not on file  . Transportation needs:    Medical: Not on file    Non-medical: Not on file  Tobacco Use  . Smoking status: Current Every Day Smoker    Packs/day: 0.50    Years: 15.00    Pack years: 7.50    Types: Cigarettes  . Smokeless tobacco: Never Used  Substance and Sexual Activity  . Alcohol use: Yes    Alcohol/week: 10.0 - 15.0 standard drinks    Types: 10 - 15 Standard drinks or equivalent per week  . Drug use: No  . Sexual activity: Yes    Partners: Male    Birth control/protection: Pill  Lifestyle  . Physical activity:    Days per week: Not on file    Minutes per session: Not on file  . Stress: Not on file  Relationships  . Social connections:    Talks on phone: Not on file    Gets together: Not on file    Attends religious service: Not on file    Active member of club or organization: Not on file    Attends meetings of clubs or organizations: Not on file    Relationship status: Not on file  . Intimate partner violence:    Fear of current or  ex partner: Not on file    Emotionally abused: Not on file    Physically abused: Not on file    Forced sexual activity: Not on file  Other Topics Concern  . Not on file  Social History Narrative  . Not on file   Health Maintenance  Topic Date Due  . INFLUENZA VACCINE  03/07/2019 (Originally 07/06/2018)  . PAP SMEAR  03/30/2021  . TETANUS/TDAP  12/07/2023  . HIV Screening  Completed    The following portions of the patient's history were reviewed and updated as appropriate: allergies, current medications, past family history, past medical history, past social history, past surgical history and problem list.  Review of Systems Pertinent items noted in HPI and remainder of comprehensive ROS otherwise negative.   Objective:    BP 98/82 (BP Location: Left Arm, Patient Position: Sitting, Cuff Size: Normal)   Pulse 68   Temp 98.5 F (36.9 C) (Oral)   Ht 5\' 5"  (1.651 m)   Wt 153 lb (69.4 kg)   LMP 11/03/2018 (Exact Date)   SpO2  98%   BMI 25.46 kg/m  General appearance: alert, cooperative, appears stated age and no distress Head: Normocephalic, without obvious abnormality, atraumatic Eyes: conjunctivae/corneas clear. PERRL, EOM's intact. Fundi benign. Ears: normal TM's and external ear canals both ears Nose: Nares normal. Septum midline. Mucosa normal. No drainage or sinus tenderness. Throat: lips, mucosa, and tongue normal; teeth and gums normal Neck: no adenopathy, no carotid bruit, no JVD, supple, symmetrical, trachea midline and thyroid not enlarged, symmetric, no tenderness/mass/nodules Lungs: clear to auscultation bilaterally Heart: regular rate and rhythm, S1, S2 normal, no murmur, click, rub or gallop Abdomen: soft, non-tender; bowel sounds normal; no masses,  no organomegaly Extremities: extremities normal, atraumatic, no cyanosis or edema Skin: Skin color, texture, turgor normal. No rashes or lesions Neurologic: Alert and oriented X 3, normal strength and tone. Normal  symmetric reflexes. Normal coordination and gait    Assessment:    Healthy female exam.with a sig drinking history.      Plan:     Anticipatory guidance given including wearing seatbelts, smoke detectors in the home, increasing physical activity, increasing p.o. intake of water and vegetables. -will obtain labs (CMP, lipids, CBC, hgb A1C) -given handodut -next CPE in 1 yr. See After Visit Summary for Counseling Recommendations    H/o alcohol abuse -drinking 20 drinks per wk. -encouraged to cut down amount of alcohol ingested -will obtain CMP -given handout  F/u in the next few months  Abbe Amsterdam, MD

## 2018-11-14 ENCOUNTER — Encounter: Payer: Self-pay | Admitting: Family Medicine

## 2018-11-23 NOTE — Telephone Encounter (Signed)
Patient called to review her lab results- results reviewed and explained, discussed diet and exercise.

## 2018-12-08 ENCOUNTER — Ambulatory Visit: Payer: 59 | Admitting: Family Medicine

## 2018-12-08 DIAGNOSIS — R05 Cough: Secondary | ICD-10-CM | POA: Diagnosis not present

## 2019-01-29 DIAGNOSIS — H6122 Impacted cerumen, left ear: Secondary | ICD-10-CM | POA: Diagnosis not present

## 2019-03-28 ENCOUNTER — Other Ambulatory Visit: Payer: Self-pay | Admitting: Certified Nurse Midwife

## 2019-03-28 DIAGNOSIS — Z3041 Encounter for surveillance of contraceptive pills: Secondary | ICD-10-CM

## 2019-03-28 NOTE — Telephone Encounter (Signed)
Medication refill request: [email or web address] Last AEX:  03-30-18 Next AEX: 04-12-2019 Last MMG (if hormonal medication request): 2018 bilateral & rt breast u/s neg Refill authorized: please approve if appropriate.

## 2019-04-12 ENCOUNTER — Ambulatory Visit: Payer: 59 | Admitting: Certified Nurse Midwife

## 2019-08-08 NOTE — Progress Notes (Signed)
38 y.o. [id number] Single  Caucasian Fe here for annual exam. Periods spotting, normal period or no period POP. Still smoking trying to decrease amount. She also has increased her alcohol use. Will be starting counseling again to work on cessation.  Trying to decrease smoking and patch has helped in the past and plans to discuss with PCP. Patient has Virtual visit with PCP today to discuss options and Pneumonia vaccine. No partner change, no STD screening needed. No other health issues today.  Patient's last menstrual period was 07/29/2019 (exact date).          Sexually active: No.  The current method of family planning is (progesterone only ).    Exercising: No.  exercise Smoker:  yes  ROS  Health Maintenance: Pap:  12-15-16 neg HPV HR neg History of Abnormal Pap: yes hx of CINII MMG:  Bilateral 05-27-17 birads 3, 12-02-17 rt breast birads 1:neg Self Breast exams: yes Colonoscopy:  none BMD:   none TDaP:  2015 Shingles: no Pneumonia: no Hep C and HIV: HIV neg 2018 Labs: PCP   reports that she has been smoking cigarettes. She has a 7.50 pack-year smoking history. She has never used smokeless tobacco. She reports current alcohol use of about 24.0 standard drinks of alcohol per week. She reports that she does not use drugs.  Past Medical History:  Diagnosis Date  . 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 Alcohol addiction ([place])   . AMENORRHEA   . Anxiety   . Depression   . Eating disorder   . GERD (gastroesophageal reflux disease)   . 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 SINUSITIS- ACUTE-NOS   . SORE THROAT   . TOBACCO USER     Past Surgical History:  Procedure Laterality Date  . COLPOSCOPY  Age 89  . LEEP  2001   CIN I, CIN II  . WISDOM TOOTH EXTRACTION  Age 39    Current Outpatient Medications  Medication Sig Dispense Refill  . Multiple Vitamins-Minerals (MULTIVITAMIN PO) Take by mouth.    . NORLYDA 0.35 MG tablet TAKE 1 TABLET(0.35 MG) BY  MOUTH DAILY 84 tablet 0  . omeprazole (PRILOSEC) 40 MG capsule TAKE 1 CAPSULE BY MOUTH EVERY DAY 90 capsule 3   No current facility-administered medications for this visit.     Family History  Problem Relation Age of Onset  . Depression Mother   . Lung cancer Father 75  . Cancer Father   . Depression Father   . Diabetes Father   . Heart disease Father   . Hyperlipidemia Father   . Hypertension Father   . Colon cancer Maternal Grandmother   . Arthritis Maternal Grandmother   . Cancer - Other Maternal Grandfather        stomache  . Leukemia Sister 70       CML  . Cancer Sister   . Depression Sister   . Mental illness Sister   . Miscarriages / Stillbirths Sister   . Diabetes Paternal Grandmother     ROS:  Pertinent items are noted in HPI.  Otherwise, a comprehensive ROS was negative.  Exam:   BP 108/66   Pulse 70   Temp (!) 97.2 F (36.2 C) (Skin)   Resp 16   Ht 5\' 5"  (1.651 m)   Wt 157 lb (71.2 kg)   LMP 07/29/2019 (Exact Date)   BMI 26.13 kg/m  Height: 5\' 5"  (165.1 cm) Ht Readings from  Last 3 Encounters:  08/15/19 5\' 5"  (1.651 m)  11/06/18 5\' 5"  (1.651 m)  08/22/18 5\' 5"  (1.651 m)    General appearance: alert, cooperative and appears stated age Head: Normocephalic, without obvious abnormality, atraumatic Neck: no adenopathy, supple, symmetrical, trachea midline and thyroid normal to inspection and palpation Lungs: clear to auscultation bilaterally Breasts: normal appearance, no masses or tenderness, No nipple retraction or dimpling, No nipple discharge or bleeding, No axillary or supraclavicular adenopathy Heart: regular rate and rhythm Abdomen: soft, non-tender; no masses,  no organomegaly Extremities: extremities normal, atraumatic, no cyanosis or edema Skin: Skin color, texture, turgor normal. No rashes or lesions Lymph nodes: Cervical, supraclavicular, and axillary nodes normal. No abnormal inguinal nodes palpated Neurologic: Grossly normal   Pelvic:  External genitalia:  no lesions, normal female              Urethra:  normal appearing urethra with no masses, tenderness or lesions              Bartholin's and Skene's: normal                 Vagina: normal appearing vagina with normal color and discharge, no lesions              Cervix: no cervical motion tenderness, no lesions and normal appearance              Pap taken: No. Bimanual Exam:  Uterus:  normal size, contour, position, consistency, mobility, non-tender and anteflexed              Adnexa: normal adnexa and no mass, fullness, tenderness               Rectovaginal: Confirms               Anus:  normal sphincter tone, no lesions  Chaperone present: yes  A:  Well Woman with normal exam  Contraception POP due being a smoker  Sees PCP for management of GERD/chronic bronchitis, smoking cessation  Smoker every day  History of abnormal pap smear with [place], CIN 1, 2    P:   Reviewed health and wellness pertinent to exam  Risks/benefits/warning signs and bleeding expectations with POP reviewed. Desires continuation.  Rx Norlyda see order with instructions  Continue follow up with PCP as indicated and with counselor.  Encouraged to continue working on smoking cessation. Aware of [facility] program.  Pap smear: no   counseled on breast self exam, STD prevention, HIV risk factors and prevention, feminine hygiene, adequate intake of calcium and vitamin D, diet and exercise  return annually or prn  An After Visit Summary was printed and given to the patient.

## 2019-08-10 ENCOUNTER — Other Ambulatory Visit: Payer: Self-pay

## 2019-08-15 ENCOUNTER — Other Ambulatory Visit: Payer: Self-pay

## 2019-08-15 ENCOUNTER — Ambulatory Visit: Payer: 59 | Admitting: Certified Nurse Midwife

## 2019-08-15 ENCOUNTER — Encounter: Payer: Self-pay | Admitting: Certified Nurse Midwife

## 2019-08-15 ENCOUNTER — Ambulatory Visit: Payer: 59 | Admitting: Family Medicine

## 2019-08-15 ENCOUNTER — Encounter: Payer: Self-pay | Admitting: Family Medicine

## 2019-08-15 VITALS — Temp 98.9°F | Wt 159.0 lb

## 2019-08-15 VITALS — BP 108/66 | HR 70 | Temp 97.2°F | Resp 16 | Ht 65.0 in | Wt 157.0 lb

## 2019-08-15 DIAGNOSIS — F17210 Nicotine dependence, cigarettes, uncomplicated: Secondary | ICD-10-CM

## 2019-08-15 DIAGNOSIS — Z01419 Encounter for gynecological examination (general) (routine) without abnormal findings: Secondary | ICD-10-CM | POA: Diagnosis not present

## 2019-08-15 DIAGNOSIS — F1020 Alcohol dependence, uncomplicated: Secondary | ICD-10-CM | POA: Diagnosis not present

## 2019-08-15 DIAGNOSIS — I498 Other specified cardiac arrhythmias: Secondary | ICD-10-CM | POA: Diagnosis not present

## 2019-08-15 DIAGNOSIS — Z8709 Personal history of other diseases of the respiratory system: Secondary | ICD-10-CM

## 2019-08-15 DIAGNOSIS — M940 Chondrocostal junction syndrome [Tietze]: Secondary | ICD-10-CM

## 2019-08-15 DIAGNOSIS — Z8659 Personal history of other mental and behavioral disorders: Secondary | ICD-10-CM

## 2019-08-15 DIAGNOSIS — Z3041 Encounter for surveillance of contraceptive pills: Secondary | ICD-10-CM

## 2019-08-15 DIAGNOSIS — Z8742 Personal history of other diseases of the female genital tract: Secondary | ICD-10-CM

## 2019-08-15 MED ORDER — NORETHINDRONE 0.35 MG PO TABS: ORAL_TABLET | ORAL | 3 refills | Status: DC

## 2019-08-15 NOTE — Patient Instructions (Signed)

## 2019-08-15 NOTE — Progress Notes (Signed)
Subjective:    Patient ID: Heather Mclean, female    DOB: 04/18/1981, 38 y.o.   MRN: [id number]  No chief complaint on file.   HPI Patient was seen today for ongoing concerns.  Pt endorses heart flutter.  Noticed in July.  Lasted 3 days.  Unsure if had increased caffeine intake at that time. States HR was increased on her fitbit, 115 when getting up and walking the dog.  Resting HR 83-85.  Denies new meds.  Denies CP, SOB, numbness or tingling in face, arms, or neck.    Also notes "smokers cough".  Endorses soreness in L rib cage near sternum with coughing.  Smoking 1/2-1 ppd.  Thinking about quitting at the end of the year.  Interested in Nicotine inhaler.  Pt drinking 6 drinks per day, 4 x per wk.  Drinking mostly light beer and a glass of white wine.  Smokes more when drinking.  Pt with h/o depression.  States feeling ok, notes some increased stress at work and dealing with COVID 19 pandemic.  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  Past Medical History:  Diagnosis Date  . 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 Alcohol addiction ([facility])   . AMENORRHEA   . Anxiety   . Depression   . Eating disorder   . GERD (gastroesophageal reflux disease)   . HPV (human papilloma virus) anogenital infection Age 38  . SINUSITIS- ACUTE-NOS   . SORE THROAT   . TOBACCO USER     No Known Allergies  ROS General: Denies fever, chills, night sweats, changes in weight, changes in appetite HEENT: Denies headaches, ear pain, changes in vision, rhinorrhea, sore throat CV: Denies CP, palpitations, SOB, orthopnea  + flutters Pulm: Denies SOB, cough, wheezing GI: Denies abdominal pain, nausea, vomiting, diarrhea, constipation GU: Denies dysuria, hematuria, frequency, vaginal discharge Msk: Denies muscle cramps, joint pains Neuro: Denies weakness, numbness, tingling Skin: Denies rashes, bruising Psych: Denies anxiety, hallucinations  +h/o depression      Objective:     Temperature 98.9 F (37.2 C), temperature source Oral, weight 159 lb (72.1 kg), last menstrual period 07/29/2019.  Gen. Pleasant, well-nourished, in no distress, normal affect   HEENT: [place]/AT, face symmetric, no scleral icterus, PERRLA, EOMI, nares patent without drainage, pharynx without erythema or exudate. Neck: No JVD, no thyromegaly, no carotid bruits Lungs: no accessory muscle use, CTAB, no wheezes or rales Cardiovascular: RRR, no m/r/g, no peripheral edema Musculoskeletal: TTP of L sternocostal area.  No deformities, no cyanosis or clubbing, normal tone Neuro:  A&Ox3, CN II-XII intact, normal gait Skin:  Warm, no lesions/ rash   Wt Readings from Last 3 Encounters:  08/15/19 157 lb (71.2 kg)  11/06/18 153 lb (69.4 kg)  08/22/18 149 lb (67.6 kg)    Lab Results  Component Value Date   WBC 9.3 11/06/2018   HGB 14.5 11/06/2018   HCT 43.0 11/06/2018   PLT 274.0 11/06/2018   GLUCOSE 95 11/06/2018   CHOL 186 11/06/2018   TRIG 167.0 (H) 11/06/2018   HDL 63.80 11/06/2018   LDLCALC 89 11/06/2018   ALT 12 11/06/2018   AST 21 11/06/2018   NA 138 11/06/2018   K 4.7 11/06/2018   CL 102 11/06/2018   CREATININE 0.64 11/06/2018   BUN 10 11/06/2018   CO2 28 11/06/2018   TSH 1.05 01/26/2010   HGBA1C 5.2 11/06/2018    Assessment/Plan:  Cigarette nicotine dependence without complication -smoking cessation counseling >  3 min, <10 min -discussed various options to help pt quit.  Considering the nicotine inhaler -will readdress at each visit  - Plan: Lipid panel  ETOHism ([place])  -discussed cutting down - Plan: Comprehensive metabolic panel  Costochondritis -pain/discomfort reproducible on exam -supportive care: NSAIDs, heat, stretching  -given handout - Plan: Vitamin D, 25-hydroxy  Periodic heart flutter  -RRR on exam -limit caffeine intake -for continued symptoms will refer to Cards for holter monitor - Plan: Lipid panel, CBC (no diff), TSH, T4, Free, Vitamin D,  25-hydroxy  F/u prn in 1 month  Grier Mitts, MD

## 2019-08-15 NOTE — Patient Instructions (Signed)
Steps to Quit Smoking Smoking tobacco is the leading cause of preventable death. It can affect almost every organ in the body. Smoking puts you and those around you at risk for developing many serious chronic diseases. Quitting smoking can be difficult, but it is one of the best things that you can do for your health. It is never too late to quit. How do I get ready to quit? When you decide to quit smoking, create a plan to help you succeed. Before you quit:  Pick a date to quit. Set a date within the next 2 weeks to give you time to prepare.  Write down the reasons why you are quitting. Keep this list in places where you will see it often.  Tell your family, friends, and co-workers that you are quitting. Support from your loved ones can make quitting easier.  Talk with your health care provider about your options for quitting smoking.  Find out what treatment options are covered by your health insurance.  Identify people, places, things, and activities that make you want to smoke (triggers). Avoid them. What first steps can I take to quit smoking?  Throw away all cigarettes at home, at work, and in your car.  Throw away smoking accessories, such as ashtrays and lighters.  Clean your car. Make sure to empty the ashtray.  Clean your home, including curtains and carpets. What strategies can I use to quit smoking? Talk with your health care provider about combining strategies, such as taking medicines while you are also receiving in-person counseling. Using these two strategies together makes you more likely to succeed in quitting than if you used either strategy on its own.  If you are pregnant or breastfeeding, talk with your health care provider about finding counseling or other support strategies to quit smoking. Do not take medicine to help you quit smoking unless your health care provider tells you to do so. To quit smoking: Quit right away  Quit smoking completely, instead of  gradually reducing how much you smoke over a period of time. Research shows that stopping smoking right away is more successful than gradually quitting.  Attend in-person counseling to help you build problem-solving skills. You are more likely to succeed in quitting if you attend counseling sessions regularly. Even short sessions of 10 minutes can be effective. Take medicine You may take medicines to help you quit smoking. Some medicines require a prescription and some you can purchase over-the-counter. Medicines may have nicotine in them to replace the nicotine in cigarettes. Medicines may:  Help to stop cravings.  Help to relieve withdrawal symptoms. Your health care provider may recommend:  Nicotine patches, gum, or lozenges.  Nicotine inhalers or sprays.  Non-nicotine medicine that is taken by mouth. Find resources Find resources and support systems that can help you to quit smoking and remain smoke-free after you quit. These resources are most helpful when you use them often. They include:  Online chats with a counselor.  Telephone quitlines.  Printed self-help materials.  Support groups or group counseling.  Text messaging programs.  Mobile phone apps or applications. Use apps that can help you stick to your quit plan by providing reminders, tips, and encouragement. There are many free apps for mobile devices as well as websites. Examples include Quit Guide from the CDC and smokefree.gov What things can I do to make it easier to quit?   Reach out to your family and friends for support and encouragement. Call telephone quitlines (1-800-QUIT-NOW), reach   out to support groups, or work with a Social worker for support.  Ask people who smoke to avoid smoking around you.  Avoid places that trigger you to smoke, such as bars, parties, or smoke-break areas at work.  Spend time with people who do not smoke.  Lessen the stress in your life. Stress can be a smoking trigger for some  people. To lessen stress, try: ? Exercising regularly. ? Doing deep-breathing exercises. ? Doing yoga. ? Meditating. ? Performing a body scan. This involves closing your eyes, scanning your body from head to toe, and noticing which parts of your body are particularly tense. Try to relax the muscles in those areas. How will I feel when I quit smoking? Day 1 to 3 weeks Within the first 24 hours of quitting smoking, you may start to feel withdrawal symptoms. These symptoms are usually most noticeable 2-3 days after quitting, but they usually do not last for more than 2-3 weeks. You may experience these symptoms:  Mood swings.  Restlessness, anxiety, or irritability.  Trouble concentrating.  Dizziness.  Strong cravings for sugary foods and nicotine.  Mild weight gain.  Constipation.  Nausea.  Coughing or a sore throat.  Changes in how the medicines that you take for unrelated issues work in your body.  Depression.  Trouble sleeping (insomnia). Week 3 and afterward After the first 2-3 weeks of quitting, you may start to notice more positive results, such as:  Improved sense of smell and taste.  Decreased coughing and sore throat.  Slower heart rate.  Lower blood pressure.  Clearer skin.  The ability to breathe more easily.  Fewer sick days. Quitting smoking can be very challenging. Do not get discouraged if you are not successful the first time. Some people need to make many attempts to quit before they achieve long-term success. Do your best to stick to your quit plan, and talk with your health care provider if you have any questions or concerns. Summary  Smoking tobacco is the leading cause of preventable death. Quitting smoking is one of the best things that you can do for your health.  When you decide to quit smoking, create a plan to help you succeed.  Quit smoking right away, not slowly over a period of time.  When you start quitting, seek help from your  health care provider, family, or friends. This information is not intended to replace advice given to you by your health care provider. Make sure you discuss any questions you have with your health care provider. Document Released: 11/16/2001 Document Revised: 02/09/2019 Document Reviewed: 02/10/2019 Elsevier Patient Education  2020 [place] Risks of Smoking Smoking cigarettes is very bad for your health. Tobacco smoke has over 200 known poisons in it. It contains the poisonous gases nitrogen oxide and carbon monoxide. There are over 60 chemicals in tobacco smoke that cause cancer. Smoking is difficult to quit because a chemical in tobacco, called nicotine, causes addiction or dependence. When you smoke and inhale, nicotine is absorbed rapidly into the bloodstream through your lungs. Both inhaled and non-inhaled nicotine may be addictive. What are the risks of cigarette smoke? Cigarette smokers have an increased risk of many serious medical problems, including:  Lung cancer.  Lung disease, such as pneumonia, bronchitis, and emphysema.  Chest pain (angina) and heart attack because the heart is not getting enough oxygen.  Heart disease and peripheral blood vessel disease.  High blood pressure (hypertension).  Stroke.  Oral cancer, including cancer of the  lip, mouth, or voice box.  Bladder cancer.  Pancreatic cancer.  Cervical cancer.  Pregnancy complications, including premature birth.  Stillbirths and smaller newborn babies, birth defects, and genetic damage to sperm.  Early menopause.  Lower estrogen level for women.  Infertility.  Facial wrinkles.  Blindness.  Increased risk of broken bones (fractures).  Senile dementia.  Stomach ulcers and internal bleeding.  Delayed wound healing and increased risk of complications during surgery.  Even smoking lightly shortens your life expectancy by several years. Because of secondhand smoke exposure, children  of smokers have an increased risk of the following:  Sudden infant death syndrome (SIDS).  Respiratory infections.  Lung cancer.  Heart disease.  Ear infections. What are the benefits of quitting? There are many health benefits of quitting smoking. Here are some of them:  Within days of quitting smoking, your risk of having a heart attack decreases, your blood flow improves, and your lung capacity improves. Blood pressure, pulse rate, and breathing patterns start returning to normal soon after quitting.  Within months, your lungs may clear up completely.  Quitting for 10 years reduces your risk of developing lung cancer and heart disease to almost that of a nonsmoker.  People who quit may see an improvement in their overall quality of life. How do I quit smoking?     Smoking is an addiction with both physical and psychological effects, and longtime habits can be hard to change. Your health care provider can recommend:  Programs and community resources, which may include group support, education, or talk therapy.  Prescription medicines to help reduce cravings.  Nicotine replacement products, such as patches, gum, and nasal sprays. Use these products only as directed. Do not replace cigarette smoking with electronic cigarettes, which are commonly called e-cigarettes. The safety of e-cigarettes is not known, and some may contain harmful chemicals.  A combination of two or more of these methods. Where to find more information  American Lung Association: www.lung.org  American Cancer Society: www.cancer.org Summary  Smoking cigarettes is very bad for your health. Cigarette smokers have an increased risk of many serious medical problems, including several cancers, heart disease, and stroke.  Smoking is an addiction with both physical and psychological effects, and longtime habits can be hard to change.  By stopping right away, you can greatly reduce the risk of medical  problems for you and your family.  To help you quit smoking, your health care provider can recommend programs, community resources, prescription medicines, and nicotine replacement products such as patches, gum, and nasal sprays. This information is not intended to replace advice given to you by your health care provider. Make sure you discuss any questions you have with your health care provider. Document Released: 12/30/2004 Document Revised: 02/23/2018 Document Reviewed: 11/26/2016 Elsevier Patient Education  2020 Elsevier Inc.  Costochondritis  Costochondritis is swelling and irritation (inflammation) of the tissue (cartilage) that connects your ribs to your breastbone (sternum). This causes pain in the front of your chest. The pain usually starts gradually and involves more than one rib. What are the causes? The exact cause of this condition is not always known. It results from stress on the cartilage where your ribs attach to your sternum. The cause of this stress could be:  Chest injury (trauma).  Exercise or activity, such as lifting.  Severe coughing. What increases the risk? You may be at higher risk for this condition if you:  Are female.  Are 6630?37 years old.  Recently started a  new exercise or work activity.  Have low levels of vitamin D.  Have a condition that makes you cough frequently. What are the signs or symptoms? The main symptom of this condition is chest pain. The pain:  Usually starts gradually and can be sharp or dull.  Gets worse with deep breathing, coughing, or exercise.  Gets better with rest.  May be worse when you press on the sternum-rib connection (tenderness). How is this diagnosed? This condition is diagnosed based on your symptoms, medical history, and a physical exam. Your health care provider will check for tenderness when pressing on your sternum. This is the most important finding. You may also have tests to rule out other causes of  chest pain. These may include:  A chest X-ray to check for lung problems.  An electrocardiogram (ECG) to see if you have a heart problem that could be causing the pain.  An imaging scan to rule out a chest or rib fracture. How is this treated? This condition usually goes away on its own over time. Your health care provider may prescribe an NSAID to reduce pain and inflammation. Your health care provider may also suggest that you:  Rest and avoid activities that make pain worse.  Apply heat or cold to the area to reduce pain and inflammation.  Do exercises to stretch your chest muscles. If these treatments do not help, your health care provider may inject a numbing medicine at the sternum-rib connection to help relieve the pain. Follow these instructions at home:  Avoid activities that make pain worse. This includes any activities that use chest, abdominal, and side muscles.  If directed, put ice on the painful area: ? Put ice in a plastic bag. ? Place a towel between your skin and the bag. ? Leave the ice on for 20 minutes, 2-3 times a day.  If directed, apply heat to the affected area as often as told by your health care provider. Use the heat source that your health care provider recommends, such as a moist heat pack or a heating pad. ? Place a towel between your skin and the heat source. ? Leave the heat on for 20-30 minutes. ? Remove the heat if your skin turns bright red. This is especially important if you are unable to feel pain, heat, or cold. You may have a greater risk of getting burned.  Take over-the-counter and prescription medicines only as told by your health care provider.  Return to your normal activities as told by your health care provider. Ask your health care provider what activities are safe for you.  Keep all follow-up visits as told by your health care provider. This is important. Contact a health care provider if:  You have chills or a fever.  Your pain  does not go away or it gets worse.  You have a cough that does not go away (is persistent). Get help right away if:  You have shortness of breath. This information is not intended to replace advice given to you by your health care provider. Make sure you discuss any questions you have with your health care provider. Document Released: 09/01/2005 Document Revised: 12/07/2017 Document Reviewed: 03/17/2016 Elsevier Patient Education  2020 [employer].

## 2019-08-16 LAB — CBC
HCT: 41.6 % (ref 36.0–46.0)
Hemoglobin: 14 g/dL (ref 12.0–15.0)
MCHC: 33.6 g/dL (ref 30.0–36.0)
MCV: 92.8 fl (ref 78.0–100.0)
Platelets: 265 10*3/uL (ref 150.0–400.0)
RBC: 4.48 Mil/uL (ref 3.87–5.11)
RDW: 13.2 % (ref 11.5–15.5)
WBC: 9.1 10*3/uL (ref 4.0–10.5)

## 2019-08-16 LAB — COMPREHENSIVE METABOLIC PANEL
ALT: 9 U/L (ref 0–35)
AST: 17 U/L (ref 0–37)
Albumin: 4.2 g/dL (ref 3.5–5.2)
Alkaline Phosphatase: 52 U/L (ref 39–117)
BUN: 15 mg/dL (ref 6–23)
CO2: 30 mEq/L (ref 19–32)
Calcium: 9.7 mg/dL (ref 8.4–10.5)
Chloride: 103 mEq/L (ref 96–112)
Creatinine, Ser: 0.6 mg/dL (ref 0.40–1.20)
GFR: 112.55 mL/min (ref >60.00)
Glucose, Bld: 81 mg/dL (ref 70–99)
Potassium: 4.5 mEq/L (ref 3.5–5.1)
Sodium: 139 mEq/L (ref 135–145)
Total Bilirubin: 0.3 mg/dL (ref 0.2–1.2)
Total Protein: 6.7 g/dL (ref 6.0–8.3)

## 2019-08-16 LAB — LIPID PANEL
Cholesterol: 178 mg/dL (ref 0–200)
HDL: 53.8 mg/dL (ref >39.00)
LDL Cholesterol: 91 mg/dL (ref 0–99)
NonHDL: 124.19
Total CHOL/HDL Ratio: 3
Triglycerides: 168 mg/dL — ABNORMAL HIGH (ref 0.0–149.0)
VLDL: 33.6 mg/dL (ref 0.0–40.0)

## 2019-08-16 LAB — VITAMIN D 25 HYDROXY (VIT D DEFICIENCY, FRACTURES): VITD: 25.45 ng/mL — ABNORMAL LOW (ref 30.00–100.00)

## 2019-08-16 LAB — T4, FREE: Free T4: 0.9 ng/dL (ref 0.60–1.60)

## 2019-08-16 LAB — TSH: TSH: 1.23 u[IU]/mL (ref 0.35–4.50)

## 2019-08-20 ENCOUNTER — Other Ambulatory Visit: Payer: Self-pay | Admitting: Family Medicine

## 2019-08-20 ENCOUNTER — Encounter: Payer: Self-pay | Admitting: Family Medicine

## 2019-08-20 DIAGNOSIS — E559 Vitamin D deficiency, unspecified: Secondary | ICD-10-CM

## 2019-08-20 MED ORDER — VITAMIN D (ERGOCALCIFEROL) 1.25 MG (50000 UNIT) PO CAPS: 50000.0000 [IU] | ORAL_CAPSULE | ORAL | 0 refills | Status: DC

## 2019-09-06 ENCOUNTER — Encounter: Payer: 59 | Admitting: Family Medicine

## 2019-09-13 ENCOUNTER — Other Ambulatory Visit: Payer: Self-pay | Admitting: Certified Nurse Midwife

## 2019-09-13 DIAGNOSIS — Z3041 Encounter for surveillance of contraceptive pills: Secondary | ICD-10-CM

## 2019-09-28 ENCOUNTER — Telehealth: Payer: Self-pay | Admitting: Certified Nurse Midwife

## 2019-09-28 NOTE — Telephone Encounter (Signed)
Patient sent the following message through [place]. Routing to triage to assist patient with request.  Kris Hartmann [facility]  Phone Number: [phone]        Sonnie Alamo,   I need a refill on my birth control and getting error messages from my pharmacy. Can you help me by following up with them?   Thank you in advance,   Shanterria Franta

## 2019-09-28 NOTE — Telephone Encounter (Signed)
Spoke with patient and advised that prescription was sent to pharmacy 08/15/19 for a year supply. Patient will call pharmacy to fill and give our office a call back if there are any questions. Closing encounter

## 2019-11-08 ENCOUNTER — Encounter: Payer: Self-pay | Admitting: Family Medicine

## 2019-11-08 ENCOUNTER — Other Ambulatory Visit: Payer: Self-pay

## 2019-11-08 ENCOUNTER — Ambulatory Visit (INDEPENDENT_AMBULATORY_CARE_PROVIDER_SITE_OTHER): Payer: 59 | Admitting: Family Medicine

## 2019-11-08 VITALS — BP 118/84 | HR 92 | Temp 97.8°F | Wt 160.0 lb

## 2019-11-08 DIAGNOSIS — F1010 Alcohol abuse, uncomplicated: Secondary | ICD-10-CM

## 2019-11-08 DIAGNOSIS — Z13220 Encounter for screening for lipoid disorders: Secondary | ICD-10-CM | POA: Diagnosis not present

## 2019-11-08 DIAGNOSIS — E559 Vitamin D deficiency, unspecified: Secondary | ICD-10-CM

## 2019-11-08 DIAGNOSIS — I498 Other specified cardiac arrhythmias: Secondary | ICD-10-CM

## 2019-11-08 DIAGNOSIS — F17210 Nicotine dependence, cigarettes, uncomplicated: Secondary | ICD-10-CM

## 2019-11-08 DIAGNOSIS — Z0000 Encounter for general adult medical examination without abnormal findings: Secondary | ICD-10-CM | POA: Diagnosis not present

## 2019-11-08 LAB — CBC WITH DIFFERENTIAL/PLATELET
Basophils Absolute: 0 10*3/uL (ref 0.0–0.1)
Basophils Relative: 0.3 % (ref 0.0–3.0)
Eosinophils Absolute: 0.1 10*3/uL (ref 0.0–0.7)
Eosinophils Relative: 1.1 % (ref 0.0–5.0)
HCT: 41.9 % (ref 36.0–46.0)
Hemoglobin: 14.2 g/dL (ref 12.0–15.0)
Lymphocytes Relative: 23 % (ref 12.0–46.0)
Lymphs Abs: 1.7 10*3/uL (ref 0.7–4.0)
MCHC: 33.9 g/dL (ref 30.0–36.0)
MCV: 91.8 fl (ref 78.0–100.0)
Monocytes Absolute: 0.5 10*3/uL (ref 0.1–1.0)
Monocytes Relative: 7.4 % (ref 3.0–12.0)
Neutro Abs: 4.9 10*3/uL (ref 1.4–7.7)
Neutrophils Relative %: 68.2 % (ref 43.0–77.0)
Platelets: 266 10*3/uL (ref 150.0–400.0)
RBC: 4.57 Mil/uL (ref 3.87–5.11)
RDW: 13 % (ref 11.5–15.5)
WBC: 7.2 10*3/uL (ref 4.0–10.5)

## 2019-11-08 LAB — COMPREHENSIVE METABOLIC PANEL
ALT: 12 U/L (ref 0–35)
AST: 17 U/L (ref 0–37)
Albumin: 4.6 g/dL (ref 3.5–5.2)
Alkaline Phosphatase: 47 U/L (ref 39–117)
BUN: 12 mg/dL (ref 6–23)
CO2: 28 mEq/L (ref 19–32)
Calcium: 10 mg/dL (ref 8.4–10.5)
Chloride: 101 mEq/L (ref 96–112)
Creatinine, Ser: 0.66 mg/dL (ref 0.40–1.20)
GFR: 100.7 mL/min (ref >60.00)
Glucose, Bld: 92 mg/dL (ref 70–99)
Potassium: 4.7 mEq/L (ref 3.5–5.1)
Sodium: 138 mEq/L (ref 135–145)
Total Bilirubin: 0.5 mg/dL (ref 0.2–1.2)
Total Protein: 6.9 g/dL (ref 6.0–8.3)

## 2019-11-08 LAB — LIPID PANEL
Cholesterol: 205 mg/dL — ABNORMAL HIGH (ref 0–200)
HDL: 56.3 mg/dL (ref >39.00)
LDL Cholesterol: 121 mg/dL — ABNORMAL HIGH (ref 0–99)
NonHDL: 148.97
Total CHOL/HDL Ratio: 4
Triglycerides: 138 mg/dL (ref 0.0–149.0)
VLDL: 27.6 mg/dL (ref 0.0–40.0)

## 2019-11-08 LAB — VITAMIN D 25 HYDROXY (VIT D DEFICIENCY, FRACTURES): VITD: 46.18 ng/mL (ref 30.00–100.00)

## 2019-11-08 NOTE — Progress Notes (Signed)
Subjective:     Heather Mclean is a 38 y.o. female and is here for a comprehensive physical exam. The patient reports problems - had an episode of heart flutters.  Episode started Oct 2nd and lasted 3 days.  Pt notes she did not get much sleep at the time, per her fitbit.  Pt drinks 5-6 cups of coffee throughout the day.  Pt also notes increased anxiety and depression.  Pt started biweekly therapy a few months ago.  Pt recently started walking on the treadmill which helped relieve stress.  Pt walking her dog during the week.  Smoking 10 cigarettes per day.  Drinking 6 drinks (wine or beer) per day, states may have 26/wk.  Pt notes she has cut down.  Drinking after work.  Denies withdrawal symptoms or feeling like she has a problem.  Denies needing an eye opener or others commenting on her drinking.  Social History   Socioeconomic History  . Marital status: Single    Spouse name: Not on file  . Number of children: Not on file  . Years of education: Not on file  . Highest education level: Not on file  Occupational History  . Not on file  Social Needs  . Financial resource strain: Not on file  . Food insecurity    Worry: Not on file    Inability: Not on file  . Transportation needs    Medical: Not on file    Non-medical: Not on file  Tobacco Use  . Smoking status: Current Every Day Smoker    Packs/day: 0.50    Years: 15.00    Pack years: 7.50    Types: Cigarettes  . Smokeless tobacco: Never Used  Substance and Sexual Activity  . Alcohol use: Yes    Alcohol/week: 24.0 standard drinks    Types: 24 Standard drinks or equivalent per week  . Drug use: No  . Sexual activity: Not Currently    Partners: Male    Birth control/protection: Pill  Lifestyle  . Physical activity    Days per week: Not on file    Minutes per session: Not on file  . Stress: Not on file  Relationships  . Social Herbalist on phone: Not on file    Gets together: Not on file    Attends  religious service: Not on file    Active member of club or organization: Not on file    Attends meetings of clubs or organizations: Not on file    Relationship status: Not on file  . Intimate partner violence    Fear of current or ex partner: Not on file    Emotionally abused: Not on file    Physically abused: Not on file    Forced sexual activity: Not on file  Other Topics Concern  . Not on file  Social History Narrative  . Not on file   Health Maintenance  Topic Date Due  . INFLUENZA VACCINE  07/07/2019  . PAP SMEAR-Modifier  03/30/2021  . TETANUS/TDAP  12/07/2023  . HIV Screening  Completed    The following portions of the patient's history were reviewed and updated as appropriate: allergies, current medications, past family history, past medical history, past social history, past surgical history and problem list.  Review of Systems Pertinent items noted in HPI and remainder of comprehensive ROS otherwise negative.   Objective:    BP 118/84 (BP Location: Left Arm, Patient Position: Sitting, Cuff Size: Normal)   Pulse  92   Temp 97.8 F (36.6 C) (Temporal)   Wt 160 lb (72.6 kg)   LMP 11/06/2019 (Exact Date)   SpO2 98%   BMI 26.63 kg/m  General appearance: alert, cooperative and no distress Head: Normocephalic, without obvious abnormality, atraumatic Eyes: conjunctivae/corneas clear. PERRL, EOM's intact. Fundi benign. Ears: normal TM's and external ear canals both ears Nose: Nares normal. Septum midline. Mucosa normal. No drainage or sinus tenderness. Throat: lips, mucosa, and tongue normal; teeth and gums normal Neck: no adenopathy, no carotid bruit, no JVD, supple, symmetrical, trachea midline and thyroid not enlarged, symmetric, no tenderness/mass/nodules Lungs: clear to auscultation bilaterally Heart: regular rate and rhythm, S1, S2 normal, no murmur, click, rub or gallop Abdomen: soft, non-tender; bowel sounds normal; no masses,  no organomegaly Extremities:  extremities normal, atraumatic, no cyanosis or edema Pulses: 2+ and symmetric Skin: Skin color, texture, turgor normal. No rashes or lesions no spider telangiectasias noted Lymph nodes: Cervical, supraclavicular, and axillary nodes normal. Neurologic: Alert and oriented X 3, normal strength and tone. Normal symmetric reflexes. Normal coordination and gait Coordination: cerebellar arm drift absent bilaterally  No tremor noted.   Assessment:    Healthy female exam with EtOH abuse and nicotine abuse.     Plan:     Anticipatory guidance given including wearing seatbelts, smoke detectors in the home, increasing physical activity, increasing p.o. intake of water and vegetables. -will obtain labs -Declines influenza vaccine at this time -given handouts -next CPE in 1 yr See After Visit Summary for Counseling Recommendations    Periodic heart flutter -pt advised to decrease caffeine intake, EtOH use, and nicotine use -Discussed decreasing stress and getting enough sleep will also help -consider holter monitor for continued symptoms -Given handout  ETOH abuse  -Substance abuse counseling provided -Patient is not ready to quit.  -Advised patient to try cutting down on alcohol intake.  Currently drinking 26+ drinks per week -Given handout - Plan: CMP  Cigarette nicotine dependence without complication  -Smoking cessation greater than 3 minutes, less than 10 minutes -Patient advised to cut down. -Given information about quit aids -We will reassess at each visit - Plan: CBC with Differential/Platelet  Vitamin D deficiency  - Plan: Vitamin D, 25-hydroxy  Screening for cholesterol level - Plan: Lipid Panel  Follow-up in the next 2 to 3 months, sooner if needed  Abbe Amsterdam, MD

## 2019-11-08 NOTE — Patient Instructions (Signed)
Preventive Care 73-37 Years Old, Female Preventive care refers to visits with your health care provider and lifestyle choices that can promote health and wellness. This includes:  A yearly physical exam. This may also be called an annual well check.  Regular dental visits and eye exams.  Immunizations.  Screening for certain conditions.  Healthy lifestyle choices, such as eating a healthy diet, getting regular exercise, not using drugs or products that contain nicotine and tobacco, and limiting alcohol use. What can I expect for my preventive care visit? Physical exam Your health care provider will check your:  Height and weight. This may be used to calculate body mass index (BMI), which tells if you are at a healthy weight.  Heart rate and blood pressure.  Skin for abnormal spots. Counseling Your health care provider may ask you questions about your:  Alcohol, tobacco, and drug use.  Emotional well-being.  Home and relationship well-being.  Sexual activity.  Eating habits.  Work and work Statistician.  Method of birth control.  Menstrual cycle.  Pregnancy history. What immunizations do I need?  Influenza (flu) vaccine  This is recommended every year. Tetanus, diphtheria, and pertussis (Tdap) vaccine  You may need a Td booster every 10 years. Varicella (chickenpox) vaccine  You may need this if you have not been vaccinated. Human papillomavirus (HPV) vaccine  If recommended by your health care provider, you may need three doses over 6 months. Measles, mumps, and rubella (MMR) vaccine  You may need at least one dose of MMR. You may also need a second dose. Meningococcal conjugate (MenACWY) vaccine  One dose is recommended if you are age 29-21 years and a first-year college student living in a residence hall, or if you have one of several medical conditions. You may also need additional booster doses. Pneumococcal conjugate (PCV13) vaccine  You may need  this if you have certain conditions and were not previously vaccinated. Pneumococcal polysaccharide (PPSV23) vaccine  You may need one or two doses if you smoke cigarettes or if you have certain conditions. Hepatitis A vaccine  You may need this if you have certain conditions or if you travel or work in places where you may be exposed to hepatitis A. Hepatitis B vaccine  You may need this if you have certain conditions or if you travel or work in places where you may be exposed to hepatitis B. Haemophilus influenzae type b (Hib) vaccine  You may need this if you have certain conditions. You may receive vaccines as individual doses or as more than one vaccine together in one shot (combination vaccines). Talk with your health care provider about the risks and benefits of combination vaccines. What tests do I need?  Blood tests  Lipid and cholesterol levels. These may be checked every 5 years starting at age 74.  Hepatitis C test.  Hepatitis B test. Screening  Diabetes screening. This is done by checking your blood sugar (glucose) after you have not eaten for a while (fasting).  Sexually transmitted disease (STD) testing.  BRCA-related cancer screening. This may be done if you have a family history of breast, ovarian, tubal, or peritoneal cancers.  Pelvic exam and Pap test. This may be done every 3 years starting at age 3. Starting at age 65, this may be done every 5 years if you have a Pap test in combination with an HPV test. Talk with your health care provider about your test results, treatment options, and if necessary, the need for more tests.  Follow these instructions at home: Eating and drinking   Eat a diet that includes fresh fruits and vegetables, whole grains, lean protein, and low-fat dairy.  Take vitamin and mineral supplements as recommended by your health care provider.  Do not drink alcohol if: ? Your health care provider tells you not to drink. ? You are  pregnant, may be pregnant, or are planning to become pregnant.  If you drink alcohol: ? Limit how much you have to 0-1 drink a day. ? Be aware of how much alcohol is in your drink. In the [place] one drink equals one 12 oz bottle of beer (355 mL), one 5 oz glass of wine (148 mL), or one 1 oz glass of hard liquor (44 mL). Lifestyle  Take daily care of your teeth and gums.  Stay active. Exercise for at least 30 minutes on 5 or more days each week.  Do not use any products that contain nicotine or tobacco, such as cigarettes, e-cigarettes, and chewing tobacco. If you need help quitting, ask your health care provider.  If you are sexually active, practice safe sex. Use a condom or other form of birth control (contraception) in order to prevent pregnancy and STIs (sexually transmitted infections). If you plan to become pregnant, see your health care provider for a preconception visit. What's next?  Visit your health care provider once a year for a well check visit.  Ask your health care provider how often you should have your eyes and teeth checked.  Stay up to date on all vaccines. This information is not intended to replace advice given to you by your health care provider. Make sure you discuss any questions you have with your health care provider. Document Released: 01/18/2002 Document Revised: 08/03/2018 Document Reviewed: 08/03/2018 Elsevier Patient Education  2020 [employer].  Steps to Quit Smoking Smoking tobacco is the leading cause of preventable death. It can affect almost every organ in the body. Smoking puts you and people around you at risk for many serious, long-lasting (chronic) diseases. Quitting smoking can be hard, but it is one of the best things that you can do for your health. It is never too late to quit. How do I get ready to quit? When you decide to quit smoking, make a plan to help you succeed. Before you quit:  Pick a date to quit. Set a date within the next 2  weeks to give you time to prepare.  Write down the reasons why you are quitting. Keep this list in places where you will see it often.  Tell your family, friends, and co-workers that you are quitting. Their support is important.  Talk with your doctor about the choices that may help you quit.  Find out if your health insurance will pay for these treatments.  Know the people, places, things, and activities that make you want to smoke (triggers). Avoid them. What first steps can I take to quit smoking?  Throw away all cigarettes at home, at work, and in your car.  Throw away the things that you use when you smoke, such as ashtrays and lighters.  Clean your car. Make sure to empty the ashtray.  Clean your home, including curtains and carpets. What can I do to help me quit smoking? Talk with your doctor about taking medicines and seeing a counselor at the same time. You are more likely to succeed when you do both.  If you are pregnant or breastfeeding, talk with your doctor about counseling  or other ways to quit smoking. Do not take medicine to help you quit smoking unless your doctor tells you to do so. To quit smoking: Quit right away  Quit smoking totally, instead of slowly cutting back on how much you smoke over a period of time.  Go to counseling. You are more likely to quit if you go to counseling sessions regularly. Take medicine You may take medicines to help you quit. Some medicines need a prescription, and some you can buy over-the-counter. Some medicines may contain a drug called nicotine to replace the nicotine in cigarettes. Medicines may:  Help you to stop having the desire to smoke (cravings).  Help to stop the problems that come when you stop smoking (withdrawal symptoms). Your doctor may ask you to use:  Nicotine patches, gum, or lozenges.  Nicotine inhalers or sprays.  Non-nicotine medicine that is taken by mouth. Find resources Find resources and other ways  to help you quit smoking and remain smoke-free after you quit. These resources are most helpful when you use them often. They include:  Online chats with a Social worker.  Phone quitlines.  Printed Furniture conservator/restorer.  Support groups or group counseling.  Text messaging programs.  Mobile phone apps. Use apps on your mobile phone or tablet that can help you stick to your quit plan. There are many free apps for mobile phones and tablets as well as websites. Examples include Quit Guide from the [employer] and smokefree.gov  What things can I do to make it easier to quit?   Talk to your family and friends. Ask them to support and encourage you.  Call a phone quitline (1-800-QUIT-NOW), reach out to support groups, or work with a Social worker.  Ask people who smoke to not smoke around you.  Avoid places that make you want to smoke, such as: ? Bars. ? Parties. ? Smoke-break areas at work.  Spend time with people who do not smoke.  Lower the stress in your life. Stress can make you want to smoke. Try these things to help your stress: ? Getting regular exercise. ? Doing deep-breathing exercises. ? Doing yoga. ? Meditating. ? Doing a body scan. To do this, close your eyes, focus on one area of your body at a time from head to toe. Notice which parts of your body are tense. Try to relax the muscles in those areas. How will I feel when I quit smoking? Day 1 to 3 weeks Within the first 24 hours, you may start to have some problems that come from quitting tobacco. These problems are very bad 2-3 days after you quit, but they do not often last for more than 2-3 weeks. You may get these symptoms:  Mood swings.  Feeling restless, nervous, angry, or annoyed.  Trouble concentrating.  Dizziness.  Strong desire for high-sugar foods and nicotine.  Weight gain.  Trouble pooping (constipation).  Feeling like you may vomit (nausea).  Coughing or a sore throat.  Changes in how the medicines that  you take for other issues work in your body.  Depression.  Trouble sleeping (insomnia). Week 3 and afterward After the first 2-3 weeks of quitting, you may start to notice more positive results, such as:  Better sense of smell and taste.  Less coughing and sore throat.  Slower heart rate.  Lower blood pressure.  Clearer skin.  Better breathing.  Fewer sick days. Quitting smoking can be hard. Do not give up if you fail the first time. Some people need  to try a few times before they succeed. Do your best to stick to your quit plan, and talk with your doctor if you have any questions or concerns. Summary  Smoking tobacco is the leading cause of preventable death. Quitting smoking can be hard, but it is one of the best things that you can do for your health.  When you decide to quit smoking, make a plan to help you succeed.  Quit smoking right away, not slowly over a period of time.  When you start quitting, seek help from your doctor, family, or friends. This information is not intended to replace advice given to you by your health care provider. Make sure you discuss any questions you have with your health care provider. Document Released: 09/18/2009 Document Revised: 02/09/2019 Document Reviewed: 02/10/2019 Elsevier Patient Education  2020 [employer].  Steps to Quit Smoking Smoking tobacco is the leading cause of preventable death. It can affect almost every organ in the body. Smoking puts you and people around you at risk for many serious, long-lasting (chronic) diseases. Quitting smoking can be hard, but it is one of the best things that you can do for your health. It is never too late to quit. How do I get ready to quit? When you decide to quit smoking, make a plan to help you succeed. Before you quit:  Pick a date to quit. Set a date within the next 2 weeks to give you time to prepare.  Write down the reasons why you are quitting. Keep this list in places where you  will see it often.  Tell your family, friends, and co-workers that you are quitting. Their support is important.  Talk with your doctor about the choices that may help you quit.  Find out if your health insurance will pay for these treatments.  Know the people, places, things, and activities that make you want to smoke (triggers). Avoid them. What first steps can I take to quit smoking?  Throw away all cigarettes at home, at work, and in your car.  Throw away the things that you use when you smoke, such as ashtrays and lighters.  Clean your car. Make sure to empty the ashtray.  Clean your home, including curtains and carpets. What can I do to help me quit smoking? Talk with your doctor about taking medicines and seeing a counselor at the same time. You are more likely to succeed when you do both.  If you are pregnant or breastfeeding, talk with your doctor about counseling or other ways to quit smoking. Do not take medicine to help you quit smoking unless your doctor tells you to do so. To quit smoking: Quit right away  Quit smoking totally, instead of slowly cutting back on how much you smoke over a period of time.  Go to counseling. You are more likely to quit if you go to counseling sessions regularly. Take medicine You may take medicines to help you quit. Some medicines need a prescription, and some you can buy over-the-counter. Some medicines may contain a drug called nicotine to replace the nicotine in cigarettes. Medicines may:  Help you to stop having the desire to smoke (cravings).  Help to stop the problems that come when you stop smoking (withdrawal symptoms). Your doctor may ask you to use:  Nicotine patches, gum, or lozenges.  Nicotine inhalers or sprays.  Non-nicotine medicine that is taken by mouth. Find resources Find resources and other ways to help you quit smoking and remain  smoke-free after you quit. These resources are most helpful when you use them  often. They include:  Online chats with a Social worker.  Phone quitlines.  Printed Furniture conservator/restorer.  Support groups or group counseling.  Text messaging programs.  Mobile phone apps. Use apps on your mobile phone or tablet that can help you stick to your quit plan. There are many free apps for mobile phones and tablets as well as websites. Examples include Quit Guide from the [employer] and smokefree.gov  What things can I do to make it easier to quit?   Talk to your family and friends. Ask them to support and encourage you.  Call a phone quitline (1-800-QUIT-NOW), reach out to support groups, or work with a Social worker.  Ask people who smoke to not smoke around you.  Avoid places that make you want to smoke, such as: ? Bars. ? Parties. ? Smoke-break areas at work.  Spend time with people who do not smoke.  Lower the stress in your life. Stress can make you want to smoke. Try these things to help your stress: ? Getting regular exercise. ? Doing deep-breathing exercises. ? Doing yoga. ? Meditating. ? Doing a body scan. To do this, close your eyes, focus on one area of your body at a time from head to toe. Notice which parts of your body are tense. Try to relax the muscles in those areas. How will I feel when I quit smoking? Day 1 to 3 weeks Within the first 24 hours, you may start to have some problems that come from quitting tobacco. These problems are very bad 2-3 days after you quit, but they do not often last for more than 2-3 weeks. You may get these symptoms:  Mood swings.  Feeling restless, nervous, angry, or annoyed.  Trouble concentrating.  Dizziness.  Strong desire for high-sugar foods and nicotine.  Weight gain.  Trouble pooping (constipation).  Feeling like you may vomit (nausea).  Coughing or a sore throat.  Changes in how the medicines that you take for other issues work in your body.  Depression.  Trouble sleeping (insomnia). Week 3 and afterward  After the first 2-3 weeks of quitting, you may start to notice more positive results, such as:  Better sense of smell and taste.  Less coughing and sore throat.  Slower heart rate.  Lower blood pressure.  Clearer skin.  Better breathing.  Fewer sick days. Quitting smoking can be hard. Do not give up if you fail the first time. Some people need to try a few times before they succeed. Do your best to stick to your quit plan, and talk with your doctor if you have any questions or concerns. Summary  Smoking tobacco is the leading cause of preventable death. Quitting smoking can be hard, but it is one of the best things that you can do for your health.  When you decide to quit smoking, make a plan to help you succeed.  Quit smoking right away, not slowly over a period of time.  When you start quitting, seek help from your doctor, family, or friends. This information is not intended to replace advice given to you by your health care provider. Make sure you discuss any questions you have with your health care provider. Document Released: 09/18/2009 Document Revised: 02/09/2019 Document Reviewed: 02/10/2019 Elsevier Patient Education  2020 [employer].  Alcohol Abuse and Nutrition Alcohol abuse is any pattern of alcohol consumption that harms your health, relationships, or work. Alcohol abuse can  cause poor nutrition (malnutrition or malnourishment) and a lack of nutrients (nutrient deficiencies), which can lead to more complications. Alcohol abuse brings malnutrition and nutrient deficiencies in two ways:  It causes your liver to work abnormally. This affects how your body divides (breaks down) and absorbs nutrients from food.  It causes you to eat poorly. Many people who abuse alcohol do not eat enough carbohydrates, protein, fat, vitamins, and minerals. Nutrients that are commonly lacking (deficient) in people who abuse alcohol include:  Vitamins. ? Vitamin A. This is needed for  your vision, metabolism, and ability to fight off infections (immunity). ? B vitamins. These include folate, thiamine, and niacin. These are needed for new cell growth. ? Vitamin C. This plays an important role in wound healing, immunity, and helping your body to absorb iron. ? Vitamin D. This is necessary for your body to absorb and use calcium. It is produced by your liver, but you can also get it from food and from sun exposure.  Minerals. ? Calcium. This is needed for healthy bones as well as heart and blood vessel (cardiovascular) function. ? Iron. This is important for blood, muscle, and nervous system functioning. ? Magnesium. This plays an important role in muscle and nerve function, and it helps to control blood sugar and blood pressure. ? Zinc. This is important for the normal functioning of your nervous system and digestive system (gastrointestinal tract). If you think that you have an alcohol dependency problem, or if it is hard to stop drinking because you feel sick or different when you do not use alcohol, talk with your health care provider or another health professional about where to get help. Nutrition is an essential factor in therapy for alcohol abuse. Your health care provider or diet and nutrition specialist (dietitian) will work with you to design a plan that can help to restore nutrients to your body and prevent the risk of complications. What is my plan? Your dietitian may develop a specific eating plan that is based on your condition and any other problems that you have. An eating plan will commonly include:  A balanced diet. ? Grains: 6-8 oz (170-227 g) a day. Examples of 1 oz of whole grains include 1 cup of whole-wheat cereal,  cup of brown rice, or 1 slice of whole-wheat bread. ? Vegetables: 2-3 cups a day. Examples of 1 cup of vegetables include 2 medium carrots, 1 large tomato, or 2 stalks of celery. ? Fruits: 1-2 cups a day. Examples of 1 cup of fruit include 1  large banana, 1 small apple, 8 large strawberries, or 1 large orange. ? Meat and other protein: 5-6 oz (142-170 g) a day.  A cut of meat or fish that is the size of a deck of cards is about 3-4 oz.  Foods that provide 1 oz of protein include 1 egg,  cup of nuts or seeds, or 1 tablespoon (16 g) of peanut butter. ? Dairy: 2-3 cups a day. Examples of 1 cup of dairy include 8 oz (230 mL) of milk, 8 oz (230 g) of yogurt, or 1 oz (44 g) of natural cheese.  Vitamin and mineral supplements. What are tips for following this plan?  Eat frequent meals and snacks. Try to eat 5-6 small meals each day.  Take vitamin or mineral supplements as recommended by your dietitian.  If you are malnourished or if your dietitian recommends it: ? You may follow a high-protein, high-calorie diet. This may include:  2,000-3,000 calories (kilocalories)  a day.  70-100 g (grams) of protein a day. ? You may be directed to follow a diet that includes a complete nutritional supplement beverage. This can help to restore calories, protein, and vitamins to your body. Depending on your condition, you may be advised to consume this beverage instead of your meals or in addition to them.  Certain medicines may cause changes in your appetite, taste, and weight. Work with your health care provider and dietitian to make any changes to your medicines and eating plan.  If you are unable to take in enough food and calories by mouth, your health care provider may recommend a feeding tube. This tube delivers nutritional supplements directly to your stomach. Recommended foods  Eat foods that are high in molecules that prevent oxygen from reacting with your food (antioxidants). These foods include grapes, berries, nuts, green tea, and dark green or orange vegetables. Eating these can help to prevent some of the stress that is placed on your liver by consuming alcohol.  Eat a variety of fresh fruits and vegetables each day. This will  help you to get fiber and vitamins in your diet.  Drink plenty of water and other clear fluids, such as apple juice and broth. Try to drink at least 48-64 oz (1.5-2 L) of water a day.  Include foods fortified with vitamins and minerals in your diet. Commonly fortified foods include milk, orange juice, cereal, and bread.  Eat a variety of foods that are high in omega-3 and omega-6 fatty acids. These include fish, nuts and seeds, and soybeans. These foods may help your liver to recover and may also stabilize your mood.  If you are a vegetarian: ? Eat a variety of protein-rich foods. ? Pair whole grains with plant-based proteins at meals and snack time. For example, eat rice with beans, put peanut butter on whole-grain toast, or eat oatmeal with sunflower seeds. The items listed above may not be a complete list of foods and beverages you can eat. Contact a dietitian for more information. Foods to avoid  Avoid foods and drinks that are high in fat and sugar. Sugary drinks, salty snacks, and candy contain empty calories. This means that they lack important nutrients such as protein, fiber, and vitamins.  Avoid alcohol. This is the best way to avoid malnutrition due to alcohol abuse. If you must drink, drink measured amounts. Measured drinking means limiting your intake to no more than 1 drink a day for nonpregnant women and 2 drinks a day for men. One drink equals 12 oz (355 mL) of beer, 5 oz (148 mL) of wine, or 1 oz (44 mL) of hard liquor.  Limit your intake of caffeine. Replace drinks like coffee and black tea with decaffeinated coffee and decaffeinated herbal tea. The items listed above may not be a complete list of foods and beverages you should avoid. Contact a dietitian for more information. Summary  Alcohol abuse can cause poor nutrition (malnutrition or malnourishment) and a lack of nutrients (nutrient deficiencies), which can lead to more health problems.  Common nutrient deficiencies  include vitamin deficiencies (A, B, C, and D) and mineral deficiencies (calcium, iron, magnesium, and zinc).  Nutrition is an essential factor in therapy for alcohol abuse.  Your health care provider and dietitian can help you to develop a specific eating plan that includes a balanced diet plus vitamin and mineral supplements. This information is not intended to replace advice given to you by your health care provider. Make sure you  discuss any questions you have with your health care provider. Document Released: 09/16/2005 Document Revised: 03/13/2019 Document Reviewed: 08/09/2017 Elsevier Patient Education  [place].  Palpitations Palpitations are feelings that your heartbeat is not normal. Your heartbeat may feel like it is:  Uneven.  Faster than normal.  Fluttering.  Skipping a beat. This is usually not a serious problem. In some cases, you may need tests to rule out any serious problems. Follow these instructions at home: Pay attention to any changes in your condition. Take these actions to help manage your symptoms: Eating and drinking  Avoid: ? Coffee, tea, soft drinks, and energy drinks. ? Chocolate. ? Alcohol. ? Diet pills. Lifestyle   Try to lower your stress. These things can help you relax: ? Yoga. ? Deep breathing and meditation. ? Exercise. ? Using words and images to create positive thoughts (guided imagery). ? Using your mind to control things in your body (biofeedback).  Do not use drugs.  Get plenty of rest and sleep. Keep a regular bed time. General instructions   Take over-the-counter and prescription medicines only as told by your doctor.  Do not use any products that contain nicotine or tobacco, such as cigarettes and e-cigarettes. If you need help quitting, ask your doctor.  Keep all follow-up visits as told by your doctor. This is important. You may need more tests if palpitations do not go away or get worse. Contact a doctor if:   Your symptoms last more than 24 hours.  Your symptoms occur more often. Get help right away if you:  Have chest pain.  Feel short of breath.  Have a very bad headache.  Feel dizzy.  Pass out (faint). Summary  Palpitations are feelings that your heartbeat is uneven or faster than normal. It may feel like your heart is fluttering or skipping a beat.  Avoid food and drinks that may cause palpitations. These include caffeine, chocolate, and alcohol.  Try to lower your stress. Do not smoke or use drugs.  Get help right away if you faint or have chest pain, shortness of breath, a severe headache, or dizziness. This information is not intended to replace advice given to you by your health care provider. Make sure you discuss any questions you have with your health care provider. Document Released: 08/31/2008 Document Revised: 01/04/2018 Document Reviewed: 01/04/2018 Elsevier Patient Education  2020 [employer].

## 2019-11-23 ENCOUNTER — Other Ambulatory Visit: Payer: Self-pay | Admitting: Family Medicine

## 2019-11-26 ENCOUNTER — Telehealth: Payer: Self-pay | Admitting: Certified Nurse Midwife

## 2019-11-26 NOTE — Telephone Encounter (Signed)
Patient sent the following message through [place]. Routing to triage to assist patient with request.  [facility]  Phone Number: [phone]  What are the risks, if any, of going off birth control for a month or two? I am about to finish this months pack and considering not taking next months pack to see how I feel off birth control. Currently, I am not sexually active and getting frustrated with two periods a month. Along with a period approximately every two weeks comes cravings, cramps, and bloating. You can imagine this is not a fun way to feel two out of four weeks a month.

## 2019-11-26 NOTE — Telephone Encounter (Signed)
Patient returned call

## 2019-11-26 NOTE — Telephone Encounter (Signed)
Left message for pt to call back to triage RN.  

## 2019-11-26 NOTE — Telephone Encounter (Signed)
Spoke to pt. Pt states wanting to go off birth control for the next 2-3 months to try going back to "natural" state. Pt doesn't like the cramping, bloating and cravings she has experienced. Pt states not SA and advised to use protection to prevent pregnancy with condoms or abstinence. Pt agreeable.  Pt states feels healed mentally and emotionally from past tragic event when started OCPs for depression and mood swings. Pt wanted to let Debbi, CNM know since it was a Rx. Pt instructed to give body time to adjust back in next 2-3 months and to call with any heavy bleeding or clots. Pt verbalized understanding.  Will route to D. Hollice Espy, CNM for any additional recommendations.

## 2019-11-27 NOTE — Telephone Encounter (Signed)
Spoke to D. Hollice Espy, CNM agrees with plan. Will close encounter.

## 2019-12-04 ENCOUNTER — Ambulatory Visit: Payer: 59 | Attending: Internal Medicine

## 2019-12-04 DIAGNOSIS — Z20822 Contact with and (suspected) exposure to covid-19: Secondary | ICD-10-CM

## 2019-12-05 LAB — NOVEL CORONAVIRUS, NAA: SARS-CoV-2, NAA: NOT DETECTED

## 2020-01-08 ENCOUNTER — Telehealth: Payer: Self-pay | Admitting: Certified Nurse Midwife

## 2020-01-08 NOTE — Telephone Encounter (Signed)
Patient is having her first cycle since going off of birth control and would like to discuss with a nurse. States she is seeing "jelly-like, dark red mucous-y stuff" when going to the bathroom. Would like to make sure this is normal.

## 2020-01-08 NOTE — Telephone Encounter (Signed)
Spoke with patient. Patient started her first menses after stopping OCP. Menses started on 01/07/20. Reports flow is normal, changing a non-saturated pad q "few hours", few "dime size clots", "jelly like mucous" and menses like cramps. Denies pelvic pain, fever/chills, N/V. Cramps resolve with motrin. Patient asking if there is anything she should be concerned about? Patient does not want to be on contraceptives, would like to "give her body a break".    Advised patient to continue to monitor and calendar menses. Return call to office if cycles become heavy, passing large clots or pain develops.  Advised patient to use BUM if SA. Will update Heather Mclean, CNM and return call if any additional recommendations. Patient agreeable.   Routing to Paulyne Mooty for final review. Patient is agreeable to disposition. Will close encounter.

## 2020-01-08 NOTE — Telephone Encounter (Signed)
Patient is returning a call to [name]. °

## 2020-01-08 NOTE — Telephone Encounter (Signed)
Agree with plan 

## 2020-01-08 NOTE — Telephone Encounter (Signed)
Left message to call Felipe Cabell, RN at [facility] [phone].   

## 2020-02-05 ENCOUNTER — Other Ambulatory Visit: Payer: Self-pay

## 2020-02-05 ENCOUNTER — Telehealth: Payer: Self-pay | Admitting: Family Medicine

## 2020-02-05 NOTE — Telephone Encounter (Signed)
LVM  for pt to call the office on changing her appointment from office visit to virtual

## 2020-02-05 NOTE — Telephone Encounter (Signed)
Pt would like to know if she could change her appointment to a virtual on 02/06/2020 if she does not need to have labs done.  Was not able to tell her if she was needing labs or not pt would like to have a call back to let her know if she can do it virutally.

## 2020-02-06 ENCOUNTER — Encounter: Payer: Self-pay | Admitting: Family Medicine

## 2020-02-06 ENCOUNTER — Telehealth (INDEPENDENT_AMBULATORY_CARE_PROVIDER_SITE_OTHER): Payer: 59 | Admitting: Family Medicine

## 2020-02-06 DIAGNOSIS — R232 Flushing: Secondary | ICD-10-CM

## 2020-02-06 DIAGNOSIS — R002 Palpitations: Secondary | ICD-10-CM | POA: Diagnosis not present

## 2020-02-06 DIAGNOSIS — E782 Mixed hyperlipidemia: Secondary | ICD-10-CM

## 2020-02-06 NOTE — Progress Notes (Signed)
Virtual Visit via Video Note  I connected with Heather Mclean on 02/06/20 at  8:00 AM EST by a video enabled telemedicine application 2/2 [id number]-76 pandemic and verified that I am speaking with the correct person using two identifiers.  Location patient: home Location provider:work or home office Persons participating in the virtual visit: patient, provider  I discussed the limitations of evaluation and management by telemedicine and the availability of in person appointments. The patient expressed understanding and agreed to proceed.   HPI: Pt lost 4-5 lbs since last visit through diet and exercise.  Pt also cut down on intake of cheese or using feta instead of a higher fat cheese.  Also started taking a Vit D supplement.  Pt cut down on coffee intake which has helped with heart flutters.  Now drinking green tea in the afternoon.  Working on EtOH reduction.  Had an episode of getting hot/clamy with "brain fog".  It was around the time of her period after stopping birth control.  Unsure if was related to being stressed.   ROS: See pertinent positives and negatives per HPI.  Past Medical History:  Diagnosis Date  . 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 Alcohol addiction ([place])   . AMENORRHEA   . Anxiety   . Depression   . Eating disorder   . GERD (gastroesophageal reflux disease)   . 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 SINUSITIS- ACUTE-NOS   . SORE THROAT   . TOBACCO USER     Past Surgical History:  Procedure Laterality Date  . COLPOSCOPY  Age 28  . LEEP  2001   CIN I, CIN II  . WISDOM TOOTH EXTRACTION  Age 12    Family History  Problem Relation Age of Onset  . Depression Mother   . Lung cancer Father 39  . Cancer Father   . Depression Father   . Diabetes Father   . Heart disease Father   . Hyperlipidemia Father   . Hypertension Father   . Colon cancer Maternal Grandmother   . Arthritis Maternal Grandmother   . Cancer - Other Maternal  Grandfather        stomache  . Leukemia Sister 2       CML  . Cancer Sister   . Depression Sister   . Mental illness Sister   . Miscarriages / Stillbirths Sister   . Diabetes Paternal Grandmother     SOCIAL HX: Pt does [employer] for a living.  Staying busy.   Current Outpatient Medications:  Marland Kitchen  Multiple Vitamins-Minerals (MULTIVITAMIN PO), Take by mouth., Disp: , Rfl:  .  norethindrone (NORLYDA) 0.35 MG tablet, TAKE 1 TABLET(0.35 MG) BY MOUTH DAILY, Disp: 90 tablet, Rfl: 3 .  omeprazole (PRILOSEC) 40 MG capsule, TAKE 1 CAPSULE BY MOUTH EVERY DAY, Disp: 90 capsule, Rfl: 3  EXAM:  VITALS per patient if applicable:  RR 73-41 bpm   GENERAL: alert, oriented, appears well and in no acute distress  HEENT: atraumatic, conjunctiva clear, no obvious abnormalities on inspection of external nose and ears  NECK: normal movements of the head and neck  LUNGS: on inspection no signs of respiratory distress, breathing rate appears normal, no obvious gross SOB, gasping or wheezing  CV: no obvious cyanosis  MS: moves all visible extremities without noticeable abnormality  PSYCH/NEURO: pleasant and cooperative, no obvious depression or anxiety, speech and thought processing grossly intact  ASSESSMENT AND PLAN:  Discussed the following assessment  and plan:  Mixed hyperlipidemia -continue lifestyle modifications -We will recheck lipid panel in 6 months to 1 year.  Palpitations -improving -continue to decrease caffeine and alcohol intake -For continued symptoms consider Holter monitor  Flushing reaction -resolved -likely 2/2 menses starting.  Consider dehydration, hypoglycemia, vasomotor rxn. -continue to monitor  F/u in the next 3-6 months, sooner if needed.   I discussed the assessment and treatment plan with the patient. The patient was provided an opportunity to ask questions and all were answered. The patient agreed with the plan and demonstrated an understanding of the  instructions.   The patient was advised to call back or seek an in-person evaluation if the symptoms worsen or if the condition fails to improve as anticipated.   Deeann Saint, MD

## 2020-02-07 NOTE — Telephone Encounter (Signed)
Please advise 

## 2020-02-22 ENCOUNTER — Encounter: Payer: Self-pay | Admitting: Certified Nurse Midwife

## 2020-03-03 ENCOUNTER — Encounter: Payer: Self-pay | Admitting: Family Medicine

## 2020-03-03 NOTE — Telephone Encounter (Signed)
Pt scheduled for MyChart video visit tomorrow 03/04/2020 at 1.30 pm with Dr Salomon Fick

## 2020-03-04 ENCOUNTER — Telehealth (INDEPENDENT_AMBULATORY_CARE_PROVIDER_SITE_OTHER): Payer: 59 | Admitting: Family Medicine

## 2020-03-04 ENCOUNTER — Encounter: Payer: Self-pay | Admitting: Family Medicine

## 2020-03-04 VITALS — HR 86 | Temp 97.7°F | Wt 157.0 lb

## 2020-03-04 DIAGNOSIS — J302 Other seasonal allergic rhinitis: Secondary | ICD-10-CM

## 2020-03-04 DIAGNOSIS — F17210 Nicotine dependence, cigarettes, uncomplicated: Secondary | ICD-10-CM | POA: Diagnosis not present

## 2020-03-04 DIAGNOSIS — H6982 Other specified disorders of Eustachian tube, left ear: Secondary | ICD-10-CM | POA: Diagnosis not present

## 2020-03-04 DIAGNOSIS — J4520 Mild intermittent asthma, uncomplicated: Secondary | ICD-10-CM

## 2020-03-04 MED ORDER — CETIRIZINE HCL 10 MG PO TABS: 10.0000 mg | ORAL_TABLET | Freq: Every day | ORAL | 6 refills | Status: DC

## 2020-03-04 MED ORDER — ALBUTEROL SULFATE HFA 108 (90 BASE) MCG/ACT IN AERS: 2.0000 | INHALATION_SPRAY | Freq: Four times a day (QID) | RESPIRATORY_TRACT | 2 refills | Status: DC | PRN

## 2020-03-04 NOTE — Progress Notes (Signed)
Virtual Visit via Video Note  I connected with Heather Mclean  on 03/04/20 at  1:30 PM EDT by a video enabled telemedicine application 2/2 [id number] pandemic and verified that I am speaking with the correct person using two identifiers.  Location patient: home Location provider:work or home office Persons participating in the virtual visit: patient, provider  I discussed the limitations of evaluation and management by telemedicine and the availability of in person appointments. The patient expressed understanding and agreed to proceed.   HPI: Pt is a 39 with pmh sig for GERD, h/o HPV, nicotine use, anxiety, h/o eating d/o, h/o depression.  Pt waking up at 3 or 4 am with a "really bad cough".  Endorses chest tightness, coughing up clear phlegm, and SOB at night.  The coughing spells may last 1 hr and do not happen every night.  Denies coughing during the day.  Pt has some allergy issues, but not blowing her nose more than normal.  Does notice L ear pain and pressure, discomfort in posterior ear and lateral L throat, scratchy/ irritated throat.  Tried flonase in the middle of the night during coughing spells.  Taking omeprazole daily for GERD. Denies current heart burn symptoms.  Pt currently smoking 1/2 ppd.  Endorses smoking x 20 yrs.  Pt inquires if any screening or testing needs to be done given her smoking hx.  ROS: See pertinent positives and negatives per HPI.  Past Medical History:  Diagnosis Date  . 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 Alcohol addiction ([place])   . AMENORRHEA   . Anxiety   . Depression   . Eating disorder   . GERD (gastroesophageal reflux disease)   . 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 SINUSITIS- ACUTE-NOS   . SORE THROAT   . TOBACCO USER     Past Surgical History:  Procedure Laterality Date  . COLPOSCOPY  Age 39  . LEEP  2001   CIN I, CIN II  . WISDOM TOOTH EXTRACTION  Age 39    Family History  Problem Relation Age of  Onset  . Depression Mother   . Lung cancer Father 47  . Cancer Father   . Depression Father   . Diabetes Father   . Heart disease Father   . Hyperlipidemia Father   . Hypertension Father   . Colon cancer Maternal Grandmother   . Arthritis Maternal Grandmother   . Cancer - Other Maternal Grandfather        stomache  . Leukemia Sister 20       CML  . Cancer Sister   . Depression Sister   . Mental illness Sister   . Miscarriages / Stillbirths Sister   . 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   Current Outpatient Medications:  Marland Kitchen  Multiple Vitamins-Minerals (MULTIVITAMIN PO), Take by mouth., Disp: , Rfl:  .  omeprazole (PRILOSEC) 40 MG capsule, TAKE 1 CAPSULE BY MOUTH EVERY DAY (Patient taking differently: 40 mg. TAKE 1 CAPSULE BY MOUTH EVERY DAY), Disp: 90 capsule, Rfl: 3  EXAM:  VITALS per patient if applicable: RR between 12-75 bpm  GENERAL: alert, oriented, appears well and in no acute distress  HEENT: atraumatic, conjunctiva clear, no obvious abnormalities on inspection of external nose and ears  NECK: normal movements of the head and neck  LUNGS: on inspection no signs of respiratory distress, breathing  rate appears normal, no obvious gross SOB, gasping or wheezing  CV: no obvious cyanosis  MS: moves all visible extremities without noticeable abnormality  PSYCH/NEURO: pleasant and cooperative, no obvious depression or anxiety, speech and thought processing grossly intact  ASSESSMENT AND PLAN:  Discussed the following assessment and plan:  Seasonal allergies  -Ok to continue flonase -will start zyrtec - Plan: cetirizine (ZYRTEC) 10 MG tablet  Dysfunction of left eustachian tube  -likely caused by allergy symptoms -continue flonase.  Start Zyrtec - Plan: cetirizine (ZYRTEC) 10 MG tablet  Cigarette nicotine dependence without complication -smoking cessation counseling >3 min, <10 min -discussed  cutting down.  Smoking 1/2 ppd. -consider nicotine patches as helped in the past.  Avoid Chantix as caused mood changes. -discussed screening guidelines. -will continue to monitor.  Mild intermittent reactive airway disease without complication  -Given h/o tobacco use consider asthma, COPD, bronchitis. -PFTs 05/13/2014 minimal small airway obstructive disease with response to bronchodilator only in small airways.  Normal diffusion, normal lung volumes -[facility] 2020 [facility] normal per pt.  Records not available for review. -We will restart albuterol inhaler as needed -Consider referral to pulmonology for PFTs.  Dr. Jetty Duhamel - Plan: albuterol (VENTOLIN HFA) 108 (90 Base) MCG/ACT inhaler  F/u prn  I discussed the assessment and treatment plan with the patient. The patient was provided an opportunity to ask questions and all were answered. The patient agreed with the plan and demonstrated an understanding of the instructions.   The patient was advised to call back or seek an in-person evaluation if the symptoms worsen or if the condition fails to improve as anticipated.   Deeann Saint, MD

## 2020-03-12 ENCOUNTER — Telehealth: Payer: Self-pay | Admitting: Family Medicine

## 2020-03-12 ENCOUNTER — Encounter: Payer: Self-pay | Admitting: Family Medicine

## 2020-03-12 ENCOUNTER — Other Ambulatory Visit: Payer: Self-pay

## 2020-03-12 ENCOUNTER — Ambulatory Visit: Payer: 59 | Admitting: Family Medicine

## 2020-03-12 VITALS — BP 122/66 | HR 80 | Temp 97.9°F | Wt 156.4 lb

## 2020-03-12 DIAGNOSIS — T8069XA Other serum reaction due to other serum, initial encounter: Secondary | ICD-10-CM

## 2020-03-12 NOTE — Patient Instructions (Signed)
Suspect local allergic reaction  Try some icing 15 minutes several times daily  Consider OTC anti-histamine such as Zyrtec or Allegra.

## 2020-03-12 NOTE — Telephone Encounter (Signed)
Please advise 

## 2020-03-12 NOTE — Telephone Encounter (Signed)
Pt is calling in stating that she got her 2nd dose of the COVID vaccine and is having some redness, warm, and tenderness not at the injection site but on the side of the arm between the elbow and top of the arm.  Pt was transferred to the triage nurse and she also wanted a message to go back to Dr. Salomon Fick.

## 2020-03-12 NOTE — Progress Notes (Signed)
  Subjective:     Patient ID: Heather Mclean, female   DOB: 05/19/1981, 39 y.o.   MRN: [id number]  HPI   Heather Mclean seen with some local swelling and itching involving her left proximal arm.  She received her second Covid vaccination Saturday morning and this was [employer] vaccine.  She had some mild fatigue and mild chills but no fever.  She denies any arm pain.  She does not recall any significant reaction after the first vaccine.  She has not tried any icing or antihistamine.  Has not noted any swelling of the hand, wrist, or forearm of the left upper extremity.  Has not noted any left axillary swelling or pain.  No generalized rash.  No dyspnea.  No facial swelling.  Past Medical History:  Diagnosis Date  . 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 Alcohol addiction ([facility])   . AMENORRHEA   . Anxiety   . Depression   . Eating disorder   . GERD (gastroesophageal reflux disease)   . HPV (human papilloma virus) anogenital infection Age 52  . SINUSITIS- ACUTE-NOS   . SORE THROAT   . TOBACCO USER    Past Surgical History:  Procedure Laterality Date  . COLPOSCOPY  Age 39  . LEEP  2001   CIN I, CIN II  . WISDOM TOOTH EXTRACTION  Age 45    reports that she has been smoking cigarettes. She has a 7.50 pack-year smoking history. She has never used smokeless tobacco. She reports current alcohol use of about 24.0 standard drinks of alcohol per week. She reports that she does not use drugs. family history includes Arthritis in her maternal grandmother; Cancer in her father and sister; Cancer - Other in her maternal grandfather; Colon cancer in her maternal grandmother; Depression in her father, mother, and sister; Diabetes in her father and paternal grandmother; Heart disease in her father; Hyperlipidemia in her father; Hypertension in her father; Leukemia (age of onset: 26) in her sister; Lung cancer (age of onset: 75) in her father; Mental illness in her sister; Miscarriages /  Stillbirths in her sister. No Known Allergies   Review of Systems  Constitutional: Negative for chills and fever.  Respiratory: Negative for shortness of breath.   Cardiovascular: Negative for chest pain.  Hematological: Negative for adenopathy.       Objective:   Physical Exam Vitals reviewed.  Constitutional:      Appearance: Normal appearance.  Cardiovascular:     Rate and Rhythm: Normal rate and regular rhythm.  Pulmonary:     Effort: Pulmonary effort is normal.     Breath sounds: Normal breath sounds.  Skin:    Comments: She has approximately 5 x 6 cm area of mild erythema involving the proximal left arm.  There is no induration.  Minimally warm to touch.  Nontender.  Neurological:     Mental Status: She is alert.        Assessment:     Local allergic reaction following second Covid vaccine which was [employer] vaccine.  Patient's main concern was making sure no clinical evidence for DVT.  We do not see any.    Plan:     -We recommend that she try some icing intermittently and also consider over-the-counter antihistamine for any itching  -Follow-up with primary for any progressive swelling or other concerns  Kristian Covey MD [facility]

## 2020-03-13 NOTE — Telephone Encounter (Signed)
Noted. Pt seen in office.

## 2020-04-30 ENCOUNTER — Encounter: Payer: Self-pay | Admitting: Family Medicine

## 2020-05-28 ENCOUNTER — Other Ambulatory Visit: Payer: Self-pay | Admitting: Family Medicine

## 2020-05-28 DIAGNOSIS — J4520 Mild intermittent asthma, uncomplicated: Secondary | ICD-10-CM

## 2020-05-29 ENCOUNTER — Encounter: Payer: Self-pay | Admitting: Family Medicine

## 2020-05-29 NOTE — Telephone Encounter (Signed)
This only goes to billing correct? Can we do anything about this in office?  Please advise

## 2020-07-11 ENCOUNTER — Telehealth (INDEPENDENT_AMBULATORY_CARE_PROVIDER_SITE_OTHER): Payer: 59 | Admitting: Internal Medicine

## 2020-07-11 ENCOUNTER — Encounter: Payer: Self-pay | Admitting: Internal Medicine

## 2020-07-11 ENCOUNTER — Other Ambulatory Visit: Payer: Self-pay

## 2020-07-11 VITALS — Ht 65.0 in | Wt 152.7 lb

## 2020-07-11 DIAGNOSIS — J039 Acute tonsillitis, unspecified: Secondary | ICD-10-CM

## 2020-07-11 DIAGNOSIS — J3501 Chronic tonsillitis: Secondary | ICD-10-CM

## 2020-07-11 MED ORDER — AMOXICILLIN 500 MG PO CAPS: 500.0000 mg | ORAL_CAPSULE | Freq: Two times a day (BID) | ORAL | 0 refills | Status: AC

## 2020-07-11 NOTE — Progress Notes (Signed)
Virtual Visit via Video Note  I connected with Heather Mclean on 07/11/20 at  3:45 PM EDT by a video enabled telemedicine application and verified that I am speaking with the correct person using two identifiers.  Location patient: home Location provider: work office Persons participating in the virtual visit: patient, provider  I discussed the limitations of evaluation and management by telemedicine and the availability of in person appointments. The patient expressed understanding and agreed to proceed.   HPI: 3 days ago she started having sore throat.  This progressed to pain of the right side of her jaw and sometimes ear.  Yesterday while looking in the mirror she thinks she noticed a white spot the size of a bean on her right tonsil.  Today the right side of her tongue is also sore.  She has been noticing some difficulty eating and drinking but she has no difficulty breathing.   ROS: Constitutional: Denies fever, chills, diaphoresis, appetite change and fatigue.  HEENT: Denies photophobia, eye pain, redness, hearing loss, ear pain, congestion, sore throat, rhinorrhea, sneezing, mouth sores, trouble swallowing, neck pain, neck stiffness and tinnitus.   Respiratory: Denies SOB, DOE, cough, chest tightness,  and wheezing.   Cardiovascular: Denies chest pain, palpitations and leg swelling.  Gastrointestinal: Denies nausea, vomiting, abdominal pain, diarrhea, constipation, blood in stool and abdominal distention.  Genitourinary: Denies dysuria, urgency, frequency, hematuria, flank pain and difficulty urinating.  Endocrine: Denies: hot or cold intolerance, sweats, changes in hair or nails, polyuria, polydipsia. Musculoskeletal: Denies myalgias, back pain, joint swelling, arthralgias and gait problem.  Skin: Denies pallor, rash and wound.  Neurological: Denies dizziness, seizures, syncope, weakness, light-headedness, numbness and headaches.  Hematological: Denies adenopathy.  Easy bruising, personal or family bleeding history  Psychiatric/Behavioral: Denies suicidal ideation, mood changes, confusion, nervousness, sleep disturbance and agitation   Past Medical History:  Diagnosis Date  . 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 Alcohol addiction (HCC)   . AMENORRHEA   . Anxiety   . Depression   . Eating disorder   . GERD (gastroesophageal reflux disease)   . HPV (human papilloma virus) anogenital infection Age 58  . SINUSITIS- ACUTE-NOS   . SORE THROAT   . TOBACCO USER     Past Surgical History:  Procedure Laterality Date  . COLPOSCOPY  Age 83  . LEEP  2001   CIN I, CIN II  . WISDOM TOOTH EXTRACTION  Age 41    Family History  Problem Relation Age of Onset  . Depression Mother   . Lung cancer Father 15  . Cancer Father   . Depression Father   . Diabetes Father   . Heart disease Father   . Hyperlipidemia Father   . Hypertension Father   . Colon cancer Maternal Grandmother   . Arthritis Maternal Grandmother   . Cancer - Other Maternal Grandfather        stomache  . Leukemia Sister 20       CML  . Cancer Sister   . Depression Sister   . Mental illness Sister   . Miscarriages / Stillbirths Sister   . Diabetes Paternal Grandmother     SOCIAL HX:   reports that she has been smoking cigarettes. She has a 7.50 pack-year smoking history. She has never used smokeless tobacco. She reports current alcohol use of about 24.0 standard drinks of alcohol per week. She reports that she does not use drugs.   Current Outpatient Medications:  .  albuterol (VENTOLIN HFA) 108 (90 Base) MCG/ACT inhaler, INHALE 2 PUFFS INTO THE LUNGS EVERY 6 HOURS AS NEEDED FOR WHEEZING OR SHORTNESS OF BREATH, Disp: 6.7 g, Rfl: 2 .  cetirizine (ZYRTEC) 10 MG tablet, Take 1 tablet (10 mg total) by mouth daily. (Patient taking differently: Take 10 mg by mouth as needed. ), Disp: 30 tablet, Rfl: 6 .  Multiple Vitamins-Minerals (MULTIVITAMIN PO), Take by mouth., Disp: ,  Rfl:  .  Omega-3 Fatty Acids (FISH OIL) 1000 MG CAPS, , Disp: , Rfl:  .  omeprazole (PRILOSEC) 40 MG capsule, TAKE 1 CAPSULE BY MOUTH EVERY DAY (Patient taking differently: 40 mg. TAKE 1 CAPSULE BY MOUTH EVERY DAY), Disp: 90 capsule, Rfl: 3 .  Vitamin D, Cholecalciferol, 50 MCG (2000 UT) CAPS, Take 1 capsule by mouth daily., Disp: , Rfl:  .  amoxicillin (AMOXIL) 500 MG capsule, Take 1 capsule (500 mg total) by mouth 2 (two) times daily for 7 days., Disp: 14 capsule, Rfl: 0  EXAM:   VITALS per patient if applicable: None reported  GENERAL: alert, oriented, appears well and in no acute distress  HEENT: atraumatic, conjunttiva clear, no obvious abnormalities on inspection of external nose and ears  NECK: normal movements of the head and neck  LUNGS: on inspection no signs of respiratory distress, breathing rate appears normal, no obvious gross increased work of breathing, gasping or wheezing  CV: no obvious cyanosis  MS: moves all visible extremities without noticeable abnormality  PSYCH/NEURO: pleasant and cooperative, no obvious depression or anxiety, speech and thought processing grossly intact  ASSESSMENT AND PLAN:   Infection of tonsil  -I am unable to visualize her pharyngeal/tonsillar area on video camera due to poor lighting. -Given what appears to be an exudate on her tonsil, I will go ahead and treat her with amoxicillin 500 mg twice daily for 7 days. -She knows to return in 10 to 14 days if no improvement.     I discussed the assessment and treatment plan with the patient. The patient was provided an opportunity to ask questions and all were answered. The patient agreed with the plan and demonstrated an understanding of the instructions.   The patient was advised to call back or seek an in-person evaluation if the symptoms worsen or if the condition fails to improve as anticipated.    Chaya Jan, MD  [facility]

## 2020-08-15 ENCOUNTER — Ambulatory Visit: Payer: 59 | Admitting: Certified Nurse Midwife

## 2020-08-28 NOTE — Progress Notes (Signed)
39 y.o. G40P0020 Single White or Caucasian Not Hispanic or Latino female here for annual exam.  Sexually active, uses w/d for contraception. Infrequently sexually active (every few months), uses plan B. They are considering vasectomy. They have been together x 8 years.  Period Cycle (Days): 28 Period Duration (Days): 1-2 Period Pattern: Regular Menstrual Flow: Moderate Menstrual Control: Thin pad Menstrual Control Change Freq (Hours): 4 Dysmenorrhea: (!) Moderate Dysmenorrhea Symptoms: Cramping  Ibuprofen helps her cramps.   She feels like she cries easily. She is emotional, can cry with commercials. Doesn't feel depressed, has a Veterinary surgeon.   Every few months she has a surge of heat.   Patient's last menstrual period was 08/15/2020.          Sexually active: Yes.    The current method of family planning is condoms most of the time.    Exercising: Yes.    walking Smoker:  Yes, 1/2 PPD, can be more.   Health Maintenance: Pap:  12/15/16 Neg:Neg HR HPV  12/03/14 Neg History of abnormal Pap:  Yes, hx of CINII in the early 2000's MMG:  12/02/17 BIRADS 1 negative/density c BMD:   n/a Colonoscopy: n/a TDaP:  2015 Gardasil: never, declines.    reports that she has been smoking cigarettes. She has a 7.50 pack-year smoking history. She has never used smokeless tobacco. She reports current alcohol use of about 24.0 standard drinks of alcohol per week. She reports that she does not use drugs. She was drinking 5 drinks a day, she is trying to go Monday through Thursday without drinking. Sometimes slips. She just started to try not to drink every day. She and her partner have gotten in a pattern of drinking, planning to support each other. She is an Airline pilot.   Past Medical History:  Diagnosis Date  . 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 Alcohol addiction (HCC)   . AMENORRHEA   . Anxiety   . Depression   . Eating disorder   . GERD (gastroesophageal reflux disease)   . HPV (human  papilloma virus) anogenital infection Age 71  . SINUSITIS- ACUTE-NOS   . SORE THROAT   . TOBACCO USER     Past Surgical History:  Procedure Laterality Date  . COLPOSCOPY  Age 44  . LEEP  2001   CIN I, CIN II  . WISDOM TOOTH EXTRACTION  Age 59    Current Outpatient Medications  Medication Sig Dispense Refill  . albuterol (VENTOLIN HFA) 108 (90 Base) MCG/ACT inhaler INHALE 2 PUFFS INTO THE LUNGS EVERY 6 HOURS AS NEEDED FOR WHEEZING OR SHORTNESS OF BREATH 6.7 g 2  . cetirizine (ZYRTEC) 10 MG tablet Take 1 tablet (10 mg total) by mouth daily. (Patient taking differently: Take 10 mg by mouth as needed. ) 30 tablet 6  . Multiple Vitamins-Minerals (MULTIVITAMIN PO) Take by mouth.    . Omega-3 Fatty Acids (FISH OIL) 1000 MG CAPS     . omeprazole (PRILOSEC) 40 MG capsule TAKE 1 CAPSULE BY MOUTH EVERY DAY (Patient taking differently: 40 mg. TAKE 1 CAPSULE BY MOUTH EVERY DAY) 90 capsule 3  . Vitamin D, Cholecalciferol, 50 MCG (2000 UT) CAPS Take 1 capsule by mouth daily.     No current facility-administered medications for this visit.    Family History  Problem Relation Age of Onset  . Depression Mother   . Lung cancer Father 17  . Cancer Father   . Depression Father   . Diabetes Father   .  Heart disease Father   . Hyperlipidemia Father   . Hypertension Father   . Colon cancer Maternal Grandmother   . Arthritis Maternal Grandmother   . Cancer - Other Maternal Grandfather        stomache  . Leukemia Sister 20       CML  . Cancer Sister   . Depression Sister   . Mental illness Sister   . Miscarriages / Stillbirths Sister   . Diabetes Paternal Grandmother     Review of Systems  Constitutional: Negative.   HENT: Negative.   Eyes: Negative.   Respiratory: Negative.   Cardiovascular: Negative.   Gastrointestinal: Negative.   Endocrine: Negative.   Genitourinary: Negative.   Musculoskeletal: Negative.   Skin: Negative.   Allergic/Immunologic: Negative.   Neurological:  Negative.   Hematological: Negative.   Psychiatric/Behavioral: Negative.     Exam:   BP 116/70 (BP Location: Right Arm, Patient Position: Sitting, Cuff Size: Normal)   Pulse (!) 124   Resp 14   Ht 5' 5.5" (1.664 m)   Wt 154 lb (69.9 kg)   LMP 08/15/2020   SpO2 96%   BMI 25.24 kg/m   Weight change: @WEIGHTCHANGE @ Height:   Height: 5' 5.5" (166.4 cm)  Ht Readings from Last 3 Encounters:  08/29/20 5' 5.5" (1.664 m)  07/11/20 5\' 5"  (1.651 m)  08/15/19 5\' 5"  (1.651 m)    General appearance: alert, cooperative and appears stated age Head: Normocephalic, without obvious abnormality, atraumatic Neck: no adenopathy, supple, symmetrical, trachea midline and thyroid normal to inspection and palpation Lungs: clear to auscultation bilaterally Cardiovascular: regular rate and rhythm Breasts: normal appearance, no masses or tenderness Abdomen: soft, non-tender; non distended,  no masses,  no organomegaly Extremities: extremities normal, atraumatic, no cyanosis or edema Skin: Skin color, texture, turgor normal. No rashes or lesions Lymph nodes: Cervical, supraclavicular, and axillary nodes normal. No abnormal inguinal nodes palpated Neurologic: Grossly normal   Pelvic: External genitalia:  no lesions              Urethra:  normal appearing urethra with no masses, tenderness or lesions              Bartholins and Skenes: normal                 Vagina: normal appearing vagina with normal color and discharge, no lesions              Cervix: no lesions               Bimanual Exam:  Uterus:  normal size, contour, position, consistency, mobility, non-tender              Adnexa: no mass, fullness, tenderness               Rectovaginal: Confirms               Anus:  normal sphincter tone, no lesions  chaperoned for the exam.  A:  Well Woman with normal exam  Smoker  ETOH overuse, trying to cut down.   Contraception, using W/D and plan B. Considering vasectomy  P:   Pap  with hpv  She is cutting back on ETOH use  She is planning to cut back on Smoking  Labs with primary  Discussed breast self exam  Discussed calcium and vit D intake  Declines other forms of contraception  10/15/19 for emergency contraception

## 2020-08-29 ENCOUNTER — Ambulatory Visit (INDEPENDENT_AMBULATORY_CARE_PROVIDER_SITE_OTHER): Payer: BC Managed Care – PPO | Admitting: Obstetrics and Gynecology

## 2020-08-29 ENCOUNTER — Other Ambulatory Visit: Payer: Self-pay

## 2020-08-29 ENCOUNTER — Other Ambulatory Visit (HOSPITAL_COMMUNITY)
Admission: RE | Admit: 2020-08-29 | Discharge: 2020-08-29 | Disposition: A | Discharge status: Home / Self Care | Payer: BC Managed Care – PPO | Source: Ambulatory Visit | Attending: Obstetrics and Gynecology | Admitting: Obstetrics and Gynecology

## 2020-08-29 ENCOUNTER — Encounter: Payer: Self-pay | Admitting: Obstetrics and Gynecology

## 2020-08-29 VITALS — BP 116/70 | HR 124 | Resp 14 | Ht 65.5 in | Wt 154.0 lb

## 2020-08-29 DIAGNOSIS — Z124 Encounter for screening for malignant neoplasm of cervix: Secondary | ICD-10-CM | POA: Insufficient documentation

## 2020-08-29 DIAGNOSIS — F17210 Nicotine dependence, cigarettes, uncomplicated: Secondary | ICD-10-CM

## 2020-08-29 DIAGNOSIS — Z01419 Encounter for gynecological examination (general) (routine) without abnormal findings: Secondary | ICD-10-CM

## 2020-08-29 MED ORDER — ELLA 30 MG PO TABS: 1.0000 | ORAL_TABLET | Freq: Once | ORAL | 1 refills | Status: AC

## 2020-08-29 NOTE — Patient Instructions (Signed)
EXERCISE AND DIET:  We recommended that you start or continue a regular exercise program for good health. Regular exercise means any activity that makes your heart beat faster and makes you sweat.  We recommend exercising at least 30 minutes per day at least 3 days a week, preferably 4 or 5.  We also recommend a diet low in fat and sugar.  Inactivity, poor dietary choices and obesity can cause diabetes, heart attack, stroke, and kidney damage, among others.    ALCOHOL AND SMOKING:  Women should limit their alcohol intake to no more than 7 drinks/beers/glasses of wine (combined, not each!) per week. Moderation of alcohol intake to this level decreases your risk of breast cancer and liver damage. And of course, no recreational drugs are part of a healthy lifestyle.  And absolutely no smoking or even second hand smoke. Most people know smoking can cause heart and lung diseases, but did you know it also contributes to weakening of your bones? Aging of your skin?  Yellowing of your teeth and nails?  CALCIUM AND VITAMIN D:  Adequate intake of calcium and Vitamin D are recommended.  The recommendations for exact amounts of these supplements seem to change often, but generally speaking 1,000 mg of calcium (between diet and supplement) and 800 units of Vitamin D per day seems prudent. Certain women may benefit from higher intake of Vitamin D.  If you are among these women, your doctor will have told you during your visit.    PAP SMEARS:  Pap smears, to check for cervical cancer or precancers,  have traditionally been done yearly, although recent scientific advances have shown that most women can have pap smears less often.  However, every woman still should have a physical exam from her gynecologist every year. It will include a breast check, inspection of the vulva and vagina to check for abnormal growths or skin changes, a visual exam of the cervix, and then an exam to evaluate the size and shape of the uterus and  ovaries.  And after 38 years of age, a rectal exam is indicated to check for rectal cancers. We will also provide age appropriate advice regarding health maintenance, like when you should have certain vaccines, screening for sexually transmitted diseases, bone density testing, colonoscopy, mammograms, etc.   MAMMOGRAMS:  All women over 40 years old should have a yearly mammogram. Many facilities now offer a "3D" mammogram, which may cost around $50 extra out of pocket. If possible,  we recommend you accept the option to have the 3D mammogram performed.  It both reduces the number of women who will be called back for extra views which then turn out to be normal, and it is better than the routine mammogram at detecting truly abnormal areas.    COLON CANCER SCREENING: Now recommend starting at age 45. At this time colonoscopy is not covered for routine screening until 50. There are take home tests that can be done between 45-49.   COLONOSCOPY:  Colonoscopy to screen for colon cancer is recommended for all women at age 50.  We know, you hate the idea of the prep.  We agree, BUT, having colon cancer and not knowing it is worse!!  Colon cancer so often starts as a polyp that can be seen and removed at colonscopy, which can quite literally save your life!  And if your first colonoscopy is normal and you have no family history of colon cancer, most women don't have to have it again for   10 years.  Once every ten years, you can do something that may end up saving your life, right?  We will be happy to help you get it scheduled when you are ready.  Be sure to check your insurance coverage so you understand how much it will cost.  It may be covered as a preventative service at no cost, but you should check your particular policy.      Breast Self-Awareness Breast self-awareness means being familiar with how your breasts look and feel. It involves checking your breasts regularly and reporting any changes to your  health care provider. Practicing breast self-awareness is important. A change in your breasts can be a sign of a serious medical problem. Being familiar with how your breasts look and feel allows you to find any problems early, when treatment is more likely to be successful. All women should practice breast self-awareness, including women who have had breast implants. How to do a breast self-exam One way to learn what is normal for your breasts and whether your breasts are changing is to do a breast self-exam. To do a breast self-exam: Look for Changes  1. Remove all the clothing above your waist. 2. Stand in front of a mirror in a room with good lighting. 3. Put your hands on your hips. 4. Push your hands firmly downward. 5. Compare your breasts in the mirror. Look for differences between them (asymmetry), such as: ? Differences in shape. ? Differences in size. ? Puckers, dips, and bumps in one breast and not the other. 6. Look at each breast for changes in your skin, such as: ? Redness. ? Scaly areas. 7. Look for changes in your nipples, such as: ? Discharge. ? Bleeding. ? Dimpling. ? Redness. ? A change in position. Feel for Changes Carefully feel your breasts for lumps and changes. It is best to do this while lying on your back on the floor and again while sitting or standing in the shower or tub with soapy water on your skin. Feel each breast in the following way:  Place the arm on the side of the breast you are examining above your head.  Feel your breast with the other hand.  Start in the nipple area and make  inch (2 cm) overlapping circles to feel your breast. Use the pads of your three middle fingers to do this. Apply light pressure, then medium pressure, then firm pressure. The light pressure will allow you to feel the tissue closest to the skin. The medium pressure will allow you to feel the tissue that is a little deeper. The firm pressure will allow you to feel the tissue  close to the ribs.  Continue the overlapping circles, moving downward over the breast until you feel your ribs below your breast.  Move one finger-width toward the center of the body. Continue to use the  inch (2 cm) overlapping circles to feel your breast as you move slowly up toward your collarbone.  Continue the up and down exam using all three pressures until you reach your armpit.  Write Down What You Find  Write down what is normal for each breast and any changes that you find. Keep a written record with breast changes or normal findings for each breast. By writing this information down, you do not need to depend only on memory for size, tenderness, or location. Write down where you are in your menstrual cycle, if you are still menstruating. If you are having trouble noticing differences   in your breasts, do not get discouraged. With time you will become more familiar with the variations in your breasts and more comfortable with the exam. How often should I examine my breasts? Examine your breasts every month. If you are breastfeeding, the best time to examine your breasts is after a feeding or after using a breast pump. If you menstruate, the best time to examine your breasts is 5-7 days after your period is over. During your period, your breasts are lumpier, and it may be more difficult to notice changes. When should I see my health care provider? See your health care provider if you notice:  A change in shape or size of your breasts or nipples.  A change in the skin of your breast or nipples, such as a reddened or scaly area.  Unusual discharge from your nipples.  A lump or thick area that was not there before.  Pain in your breasts.  Anything that concerns you.  

## 2020-09-02 LAB — CYTOLOGY - PAP
Comment: NEGATIVE
Diagnosis: UNDETERMINED — AB
High risk HPV: NEGATIVE

## 2020-09-26 DIAGNOSIS — F4323 Adjustment disorder with mixed anxiety and depressed mood: Secondary | ICD-10-CM | POA: Diagnosis not present

## 2020-10-13 ENCOUNTER — Encounter: Payer: Self-pay | Admitting: Family Medicine

## 2020-10-13 ENCOUNTER — Other Ambulatory Visit: Payer: Self-pay

## 2020-10-13 ENCOUNTER — Ambulatory Visit (INDEPENDENT_AMBULATORY_CARE_PROVIDER_SITE_OTHER): Payer: BC Managed Care – PPO | Admitting: Family Medicine

## 2020-10-13 VITALS — BP 110/76 | HR 110 | Temp 98.6°F | Ht 65.0 in | Wt 147.2 lb

## 2020-10-13 DIAGNOSIS — E559 Vitamin D deficiency, unspecified: Secondary | ICD-10-CM

## 2020-10-13 DIAGNOSIS — R634 Abnormal weight loss: Secondary | ICD-10-CM | POA: Diagnosis not present

## 2020-10-13 DIAGNOSIS — Z0000 Encounter for general adult medical examination without abnormal findings: Secondary | ICD-10-CM | POA: Diagnosis not present

## 2020-10-13 DIAGNOSIS — F17210 Nicotine dependence, cigarettes, uncomplicated: Secondary | ICD-10-CM

## 2020-10-13 DIAGNOSIS — F1010 Alcohol abuse, uncomplicated: Secondary | ICD-10-CM

## 2020-10-13 DIAGNOSIS — E782 Mixed hyperlipidemia: Secondary | ICD-10-CM

## 2020-10-13 DIAGNOSIS — F4321 Adjustment disorder with depressed mood: Secondary | ICD-10-CM

## 2020-10-13 NOTE — Patient Instructions (Signed)
Preventive Care 21-39 Years Old, Female Preventive care refers to visits with your health care provider and lifestyle choices that can promote health and wellness. This includes:  A yearly physical exam. This may also be called an annual well check.  Regular dental visits and eye exams.  Immunizations.  Screening for certain conditions.  Healthy lifestyle choices, such as eating a healthy diet, getting regular exercise, not using drugs or products that contain nicotine and tobacco, and limiting alcohol use. What can I expect for my preventive care visit? Physical exam Your health care provider will check your:  Height and weight. This may be used to calculate body mass index (BMI), which tells if you are at a healthy weight.  Heart rate and blood pressure.  Skin for abnormal spots. Counseling Your health care provider may ask you questions about your:  Alcohol, tobacco, and drug use.  Emotional well-being.  Home and relationship well-being.  Sexual activity.  Eating habits.  Work and work environment.  Method of birth control.  Menstrual cycle.  Pregnancy history. What immunizations do I need?  Influenza (flu) vaccine  This is recommended every year. Tetanus, diphtheria, and pertussis (Tdap) vaccine  You may need a Td booster every 10 years. Varicella (chickenpox) vaccine  You may need this if you have not been vaccinated. Human papillomavirus (HPV) vaccine  If recommended by your health care provider, you may need three doses over 6 months. Measles, mumps, and rubella (MMR) vaccine  You may need at least one dose of MMR. You may also need a second dose. Meningococcal conjugate (MenACWY) vaccine  One dose is recommended if you are age 19-21 years and a first-year college student living in a residence hall, or if you have one of several medical conditions. You may also need additional booster doses. Pneumococcal conjugate (PCV13) vaccine  You may need  this if you have certain conditions and were not previously vaccinated. Pneumococcal polysaccharide (PPSV23) vaccine  You may need one or two doses if you smoke cigarettes or if you have certain conditions. Hepatitis A vaccine  You may need this if you have certain conditions or if you travel or work in places where you may be exposed to hepatitis A. Hepatitis B vaccine  You may need this if you have certain conditions or if you travel or work in places where you may be exposed to hepatitis B. Haemophilus influenzae type b (Hib) vaccine  You may need this if you have certain conditions. You may receive vaccines as individual doses or as more than one vaccine together in one shot (combination vaccines). Talk with your health care provider about the risks and benefits of combination vaccines. What tests do I need?  Blood tests  Lipid and cholesterol levels. These may be checked every 5 years starting at age 20.  Hepatitis C test.  Hepatitis B test. Screening  Diabetes screening. This is done by checking your blood sugar (glucose) after you have not eaten for a while (fasting).  Sexually transmitted disease (STD) testing.  BRCA-related cancer screening. This may be done if you have a family history of breast, ovarian, tubal, or peritoneal cancers.  Pelvic exam and Pap test. This may be done every 3 years starting at age 21. Starting at age 30, this may be done every 5 years if you have a Pap test in combination with an HPV test. Talk with your health care provider about your test results, treatment options, and if necessary, the need for more tests.   Follow these instructions at home: Eating and drinking   Eat a diet that includes fresh fruits and vegetables, whole grains, lean protein, and low-fat dairy.  Take vitamin and mineral supplements as recommended by your health care provider.  Do not drink alcohol if: ? Your health care provider tells you not to drink. ? You are  pregnant, may be pregnant, or are planning to become pregnant.  If you drink alcohol: ? Limit how much you have to 0-1 drink a day. ? Be aware of how much alcohol is in your drink. In the [place] one drink equals one 12 oz bottle of beer (355 mL), one 5 oz glass of wine (148 mL), or one 1 oz glass of hard liquor (44 mL). Lifestyle  Take daily care of your teeth and gums.  Stay active. Exercise for at least 30 minutes on 5 or more days each week.  Do not use any products that contain nicotine or tobacco, such as cigarettes, e-cigarettes, and chewing tobacco. If you need help quitting, ask your health care provider.  If you are sexually active, practice safe sex. Use a condom or other form of birth control (contraception) in order to prevent pregnancy and STIs (sexually transmitted infections). If you plan to become pregnant, see your health care provider for a preconception visit. What's next?  Visit your health care provider once a year for a well check visit.  Ask your health care provider how often you should have your eyes and teeth checked.  Stay up to date on all vaccines. This information is not intended to replace advice given to you by your health care provider. Make sure you discuss any questions you have with your health care provider. Document Revised: 08/03/2018 Document Reviewed: 08/03/2018 Elsevier Patient Education  [place] Loss, Adult People experience loss in many different ways throughout their lives. Events such as moving, changing jobs, and losing friends can create a sense of loss. The loss may be as serious as a major health change, divorce, death of a pet, or death of a loved one. All of these types of loss are likely to create a physical and emotional reaction known as grief. Grief is the result of a major change or an absence of something or someone that you count on. Grief is a normal reaction to loss. A variety of factors can affect your  grieving experience, including:  The nature of your loss.  Your relationship to what or whom you lost.  Your understanding of grief and how to manage it.  Your support system. How to manage lifestyle changes Keep to your normal routine as much as possible.  If you have trouble focusing or doing normal activities, it is acceptable to take some time away from your normal routine.  Spend time with friends and loved ones.  Eat a healthy diet, get plenty of sleep, and rest when you feel tired. How to recognize changes  The way that you deal with your grief will affect your ability to function as you normally do. When grieving, you may experience these changes:  Numbness, shock, sadness, anxiety, anger, denial, and guilt.  Thoughts about death.  Unexpected crying.  A physical sensation of emptiness in your stomach.  Problems sleeping and eating.  Tiredness (fatigue).  Loss of interest in normal activities.  Dreaming about or imagining seeing the person who died.  A need to remember what or whom you lost.  Difficulty thinking about anything other than your  loss for a period of time.  Relief. If you have been expecting the loss for a while, you may feel a sense of relief when it happens. Follow these instructions at home:  Activity Express your feelings in healthy ways, such as:  Talking with others about your loss. It may be helpful to find others who have had a similar loss, such as a support group.  Writing down your feelings in a journal.  Doing physical activities to release stress and emotional energy.  Doing creative activities like painting, sculpting, or playing or listening to music.  Practicing resilience. This is the ability to recover and adjust after facing challenges. Reading some resources that encourage resilience may help you to learn ways to practice those behaviors. General instructions  Be patient with yourself and others. Allow the grieving  process to happen, and remember that grieving takes time. ? It is likely that you may never feel completely done with some grief. You may find a way to move on while still cherishing memories and feelings about your loss. ? Accepting your loss is a process. It can take months or longer to adjust.  Keep all follow-up visits as told by your health care provider. This is important. Where to find support To get support for managing loss:  Ask your health care provider for help and recommendations, such as grief counseling or therapy.  Think about joining a support group for people who are managing a loss. Where to find more information You can find more information about managing loss from:  American Society of Clinical Oncology: www.cancer.net  American Psychological Association: TVStereos.ch Contact a health care provider if:  Your grief is extreme and keeps getting worse.  You have ongoing grief that does not improve.  Your body shows symptoms of grief, such as illness.  You feel depressed, anxious, or lonely. Get help right away if:  You have thoughts about hurting yourself or others. If you ever feel like you may hurt yourself or others, or have thoughts about taking your own life, get help right away. You can go to your nearest emergency department or call:  Your local emergency services (911 in the [place]  A suicide crisis helpline, such as the [facility] at (434)280-8071. This is open 24 hours a day. Summary  Grief is the result of a major change or an absence of someone or something that you count on. Grief is a normal reaction to loss.  The depth of grief and the period of recovery depend on the type of loss and your ability to adjust to the change and process your feelings.  Processing grief requires patience and a willingness to accept your feelings and talk about your loss with people who are supportive.  It is important to find resources  that work for you and to realize that people experience grief differently. There is not one grieving process that works for everyone in the same way.  Be aware that when grief becomes extreme, it can lead to more severe issues like isolation, depression, anxiety, or suicidal thoughts. Talk with your health care provider if you have any of these issues. This information is not intended to replace advice given to you by your health care provider. Make sure you discuss any questions you have with your health care provider. Document Revised: 01/26/2019 Document Reviewed: 04/07/2017 Elsevier Patient Education  [place].

## 2020-10-13 NOTE — Progress Notes (Signed)
Subjective:     Heather Mclean is a 39 y.o. female and is here for a comprehensive physical exam.  Patient states her boyfriend died last week 2/2 aneurysm and history of HTN.  Patient states she coping with the loss.  Endorses smoking 1 pack/day due to increased stress and drinking several days last week.  Pt is in counseling.  Has an appt this wk.  Wants to quit smoking and drinking after everything settles down as her bf wanted her to do so prior to his passing.  Taking fish oil tabs to help with cholesterol.  Taking OTC vit D.  LMP ended 10/10/2028.  Patient had Pap done 08/2020 with ASCUS, negative HPV.  Social History   Socioeconomic History  . Marital status: Single    Spouse name: Not on file  . Number of children: Not on file  . Years of education: Not on file  . Highest education level: Not on file  Occupational History  . Not on file  Tobacco Use  . Smoking status: Current Every Day Smoker    Packs/day: 0.50    Years: 15.00    Pack years: 7.50    Types: Cigarettes  . Smokeless tobacco: Never Used  Vaping Use  . Vaping Use: Never used  Substance and Sexual Activity  . Alcohol use: Yes    Alcohol/week: 24.0 standard drinks    Types: 24 Standard drinks or equivalent per week  . Drug use: No  . Sexual activity: Not Currently    Partners: Male    Birth control/protection: Condom  Other Topics Concern  . Not on file  Social History Narrative  . Not on file   Social Determinants of Health   Financial Resource Strain:   . Difficulty of Paying Living Expenses: Not on file  Food Insecurity:   . Worried About Charity fundraiser in the Last Year: Not on file  . Ran Out of Food in the Last Year: Not on file  Transportation Needs:   . Lack of Transportation (Medical): Not on file  . Lack of Transportation (Non-Medical): Not on file  Physical Activity:   . Days of Exercise per Week: Not on file  . Minutes of Exercise per Session: Not on file  Stress:   . Feeling  of Stress : Not on file  Social Connections:   . Frequency of Communication with Friends and Family: Not on file  . Frequency of Social Gatherings with Friends and Family: Not on file  . Attends Religious Services: Not on file  . Active Member of Clubs or Organizations: Not on file  . Attends Archivist Meetings: Not on file  . Marital Status: Not on file  Intimate Partner Violence:   . Fear of Current or Ex-Partner: Not on file  . Emotionally Abused: Not on file  . Physically Abused: Not on file  . Sexually Abused: Not on file   Health Maintenance  Topic Date Due  . Hepatitis C Screening  Never done  . COVID-19 Vaccine (1) Never done  . INFLUENZA VACCINE  Never done  . PAP SMEAR-Modifier  08/30/2023  . TETANUS/TDAP  12/07/2023  . HIV Screening  Completed    The following portions of the patient's history were reviewed and updated as appropriate: allergies, current medications, past family history, past medical history, past social history, past surgical history and problem list.  Review of Systems Pertinent items noted in HPI and remainder of comprehensive ROS otherwise negative.  Objective:    BP 110/76 (BP Location: Left Arm, Patient Position: Sitting, Cuff Size: Normal)   Pulse (!) 110   Temp 98.6 F (37 C)   Ht '5\' 5"'  (1.651 m)   Wt 147 lb 3.2 oz (66.8 kg)   LMP 10/10/2020 (Exact Date)   SpO2 97%   BMI 24.50 kg/m  General appearance: alert, cooperative and no distress Head: Normocephalic, without obvious abnormality, atraumatic Eyes: conjunctivae/corneas clear. PERRL, EOM's intact. Fundi benign. Ears: normal TM's and external ear canals both ears Nose: Nares normal. Septum midline. Mucosa normal. No drainage or sinus tenderness. Throat: lips, mucosa, and tongue normal; teeth and gums normal Neck: no adenopathy, no carotid bruit, no JVD, supple, symmetrical, trachea midline and thyroid not enlarged, symmetric, no tenderness/mass/nodules Lungs: clear to  auscultation bilaterally Heart: regular rate and rhythm, S1, S2 normal, no murmur, click, rub or gallop Abdomen: soft, non-tender; bowel sounds normal; no masses,  no organomegaly Extremities: extremities normal, atraumatic, no cyanosis or edema Pulses: 2+ and symmetric Skin: Skin color, texture, turgor normal. No rashes or lesions Lymph nodes: Cervical, supraclavicular, and axillary nodes normal. Neurologic: Alert and oriented X 3, normal strength and tone. Normal symmetric reflexes. Normal coordination and gait    Assessment:   Pt is a 39 yo female with normal exam, with h/o depression, tobacco and EtOH abuse who is tearful as grieving the unexpected loss of her bf.     Plan:     Anticipatory guidance given including wearing seatbelts, smoke detectors in the home, increasing physical activity, increasing p.o. intake of water and vegetables. -will obtain labs -pap done 08/2020: ASCUS, HPV negative.  Continue follow-up with OB/Gyn -Completed 2 Covid vaccines.  Per chart review localized allergic reaction after second vaccine. -Given handout -Next CPE in 1 year See After Visit Summary for Counseling Recommendations    Cigarette nicotine dependence without complication  -Smoking cessation counseling greater than 3 minutes, less than 10 minutes -Currently smoking 1 pack/day -Patient plans to quit after she is grieving. -Offered quit aids.  Patient declines at this time -We'll continue to monitor - Plan: CMP with eGFR([facility]), CBC with Differential/Platelet  ETOH abuse  -Discussed cutting down -Difficult at this time 2/2 grieving -Patient advised to continue counseling -We'll continue to monitor - Plan: CMP with eGFR([facility]), CBC with Differential/Platelet, Folate, Vitamin B12  Mixed hyperlipidemia  - Plan: Lipid panel  Grief -Endorses good family support -Patient encouraged to keep follow-up appointment with counselor -Given handout -f/u in the next few wks as needed given h/o  depression and concern for increased EtOH use/abuse.  Weight loss  -2/2 decreased appetite due to grieving and EtOH use -Patient encouraged to try eating small meals  - Plan: CMP with eGFR([facility]), Hemoglobin A1c, TSH, T4, free  Vitamin D deficiency -Continue OTC vitamin D 2000 IU daily - Plan: Vitamin D, 25-hydroxy  F/u in 2-4 weeks, sooner if needed.  Grier Mitts, MD

## 2020-10-14 DIAGNOSIS — F4323 Adjustment disorder with mixed anxiety and depressed mood: Secondary | ICD-10-CM | POA: Diagnosis not present

## 2020-10-14 LAB — FOLATE: Folate: >24 ng/mL

## 2020-10-14 LAB — LIPID PANEL
Cholesterol: 210 mg/dL — ABNORMAL HIGH (ref <200)
HDL: 68 mg/dL (ref >=50)
LDL Cholesterol (Calc): 110 mg/dL (calc) — ABNORMAL HIGH
Non-HDL Cholesterol (Calc): 142 mg/dL (calc) — ABNORMAL HIGH (ref <130)
Total CHOL/HDL Ratio: 3.1 (calc) (ref <5.0)
Triglycerides: 206 mg/dL — ABNORMAL HIGH (ref <150)

## 2020-10-14 LAB — COMPLETE METABOLIC PANEL WITH GFR
AG Ratio: 1.8 (calc) (ref 1.0–2.5)
ALT: 11 U/L (ref 6–29)
AST: 26 U/L (ref 10–30)
Albumin: 4.8 g/dL (ref 3.6–5.1)
Alkaline phosphatase (APISO): 54 U/L (ref 31–125)
BUN: 8 mg/dL (ref 7–25)
CO2: 26 mmol/L (ref 20–32)
Calcium: 10.6 mg/dL — ABNORMAL HIGH (ref 8.6–10.2)
Chloride: 98 mmol/L (ref 98–110)
Creat: 0.67 mg/dL (ref 0.50–1.10)
GFR, Est African American: 129 mL/min/{1.73_m2} (ref >=60)
GFR, Est Non African American: 112 mL/min/{1.73_m2} (ref >=60)
Globulin: 2.7 g/dL (calc) (ref 1.9–3.7)
Glucose, Bld: 82 mg/dL (ref 65–99)
Potassium: 4.4 mmol/L (ref 3.5–5.3)
Sodium: 138 mmol/L (ref 135–146)
Total Bilirubin: 0.5 mg/dL (ref 0.2–1.2)
Total Protein: 7.5 g/dL (ref 6.1–8.1)

## 2020-10-14 LAB — VITAMIN B12: Vitamin B-12: 611 pg/mL (ref 200–1100)

## 2020-10-14 LAB — HEMOGLOBIN A1C
Hgb A1c MFr Bld: 5.1 % of total Hgb (ref <5.7)
Mean Plasma Glucose: 100 (calc)
eAG (mmol/L): 5.5 (calc)

## 2020-10-14 LAB — CBC WITH DIFFERENTIAL/PLATELET
Absolute Monocytes: 429 cells/uL (ref 200–950)
Basophils Absolute: 28 cells/uL (ref 0–200)
Basophils Relative: 0.5 %
Eosinophils Absolute: 61 cells/uL (ref 15–500)
Eosinophils Relative: 1.1 %
HCT: 47.8 % — ABNORMAL HIGH (ref 35.0–45.0)
Hemoglobin: 15.7 g/dL — ABNORMAL HIGH (ref 11.7–15.5)
Lymphs Abs: 1232 cells/uL (ref 850–3900)
MCH: 30.6 pg (ref 27.0–33.0)
MCHC: 32.8 g/dL (ref 32.0–36.0)
MCV: 93.2 fL (ref 80.0–100.0)
MPV: 10.3 fL (ref 7.5–12.5)
Monocytes Relative: 7.8 %
Neutro Abs: 3751 cells/uL (ref 1500–7800)
Neutrophils Relative %: 68.2 %
Platelets: 319 10*3/uL (ref 140–400)
RBC: 5.13 10*6/uL — ABNORMAL HIGH (ref 3.80–5.10)
RDW: 11.8 % (ref 11.0–15.0)
Total Lymphocyte: 22.4 %
WBC: 5.5 10*3/uL (ref 3.8–10.8)

## 2020-10-14 LAB — T4, FREE: Free T4: 1.4 ng/dL (ref 0.8–1.8)

## 2020-10-14 LAB — VITAMIN D 25 HYDROXY (VIT D DEFICIENCY, FRACTURES): Vit D, 25-Hydroxy: 31 ng/mL (ref 30–100)

## 2020-10-14 LAB — TSH: TSH: 1.15 mIU/L

## 2020-10-18 ENCOUNTER — Encounter: Payer: Self-pay | Admitting: Family Medicine

## 2020-10-19 ENCOUNTER — Encounter: Payer: Self-pay | Admitting: Family Medicine

## 2020-10-21 DIAGNOSIS — F4323 Adjustment disorder with mixed anxiety and depressed mood: Secondary | ICD-10-CM | POA: Diagnosis not present

## 2020-10-25 ENCOUNTER — Encounter: Payer: Self-pay | Admitting: Family Medicine

## 2020-10-28 DIAGNOSIS — F4323 Adjustment disorder with mixed anxiety and depressed mood: Secondary | ICD-10-CM | POA: Diagnosis not present

## 2020-10-29 ENCOUNTER — Telehealth: Payer: Self-pay

## 2020-10-29 NOTE — Telephone Encounter (Signed)
Spoke with pt reviewed lab results with pt, state that she wants to know why her RBC and HCT are high, state that  there is no comment explaining  the reason they are elevated.

## 2020-11-03 NOTE — Telephone Encounter (Signed)
Nicotine and alcohol use can cause those increased values.  Pt has been advised to quit using both.  Please be advised that comments are made on items that are clinically significant when compared to other values.

## 2020-11-04 ENCOUNTER — Telehealth: Payer: Self-pay

## 2020-11-04 NOTE — Telephone Encounter (Signed)
Spoke with pt offered a virtual visit to further discuss lab results but pt declined. Advised pt that I will have Dr Salomon Fick review her concerns and get back with her in the morning, verbalized understanding

## 2020-11-04 NOTE — Telephone Encounter (Signed)
Message sent to Dr Banks for advise 

## 2020-11-05 ENCOUNTER — Telehealth: Payer: Self-pay

## 2020-11-05 NOTE — Telephone Encounter (Signed)
Spoke with pt advised that Dr Salomon Fick will discuss the lab results at the f/u appointment on 11/13/2020. Pt verbalized understanding

## 2020-11-08 ENCOUNTER — Ambulatory Visit: Payer: BC Managed Care – PPO | Attending: Internal Medicine

## 2020-11-08 DIAGNOSIS — Z23 Encounter for immunization: Secondary | ICD-10-CM

## 2020-11-08 NOTE — Progress Notes (Signed)
   Covid-19 Vaccination Clinic  Name:  Heather Mclean    MRN: [id number] DOB: Dec 07, 1980  11/08/2020  [name] was observed post Covid-19 immunization for 15 minutes without incident. She was provided with Vaccine Information Sheet and instruction to access the V-Safe system.   [name] was instructed to call 911 with any severe reactions post vaccine: Marland Kitchen Difficulty breathing  . Swelling of face and throat  . A fast heartbeat  . A bad rash all over body  . Dizziness and weakness   Immunizations Administered    Name Date Dose VIS Date Route   Pfizer COVID-19 Vaccine 11/08/2020  9:53 AM 0.3 mL 09/24/2020 Intramuscular   Manufacturer: [employer], [employer]   Lot: [id number]   NDC: [address]-7

## 2020-11-11 DIAGNOSIS — F4323 Adjustment disorder with mixed anxiety and depressed mood: Secondary | ICD-10-CM | POA: Diagnosis not present

## 2020-11-12 ENCOUNTER — Ambulatory Visit (INDEPENDENT_AMBULATORY_CARE_PROVIDER_SITE_OTHER): Payer: BC Managed Care – PPO | Admitting: Family Medicine

## 2020-11-12 ENCOUNTER — Encounter: Payer: Self-pay | Admitting: Family Medicine

## 2020-11-12 ENCOUNTER — Other Ambulatory Visit: Payer: Self-pay

## 2020-11-12 VITALS — BP 102/78 | HR 86 | Temp 98.8°F | Wt 145.0 lb

## 2020-11-12 DIAGNOSIS — H6122 Impacted cerumen, left ear: Secondary | ICD-10-CM

## 2020-11-12 DIAGNOSIS — D75839 Thrombocytosis, unspecified: Secondary | ICD-10-CM

## 2020-11-12 NOTE — Progress Notes (Signed)
Subjective:    Patient ID: Heather Mclean, female    DOB: [birth year], 39 y.o.   MRN: [id number]  No chief complaint on file.   HPI Patient was seen today for f/u. Pt quit drinking 9 days ago and quit smoking 8 days ago.  Patient eating raw vegetables, using Nicorette gum, and NicoDerm patch 14 MCG to help with cravings.  Pt inquires about lab results.  States MCV on CBC and platelets were abnormal. When asked about anxiety patient states it is fine. Patient endorses vivid dreams but otherwise no difficulty sleeping, changes in appetite, mood.  Pt notes L ear feels clogged.  Past Medical History:  Diagnosis Date  . 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 Alcohol addiction (HCC)   . AMENORRHEA   . Anxiety   . Depression   . Eating disorder   . GERD (gastroesophageal reflux disease)   . HPV (human papilloma virus) anogenital infection Age 47  . SINUSITIS- ACUTE-NOS   . SORE THROAT   . TOBACCO USER     No Known Allergies  ROS General: Denies fever, chills, night sweats, changes in weight, changes in appetite HEENT: Denies headaches, ear pain, changes in vision, rhinorrhea, sore throat CV: Denies CP, palpitations, SOB, orthopnea Pulm: Denies SOB, cough, wheezing GI: Denies abdominal pain, nausea, vomiting, diarrhea, constipation GU: Denies dysuria, hematuria, frequency, vaginal discharge Msk: Denies muscle cramps, joint pains Neuro: Denies weakness, numbness, tingling Skin: Denies rashes, bruising Psych: Denies depression, anxiety, hallucinations     Objective:    Blood pressure 102/78, pulse 86, temperature 98.8 F (37.1 C), temperature source Oral, weight 145 lb (65.8 kg), SpO2 98 %.   Gen. Pleasant, well-nourished, in no distress, normal affect   HEENT: [place]/AT, face symmetric, conjunctiva clear, no scleral icterus, PERRLA, EOMI, nares patent without drainage.  L canal occluded with cerumen.  TM normal after irrigation. Lungs: no accessory muscle  use Cardiovascular: RRR, no peripheral edema Neuro:  A&Ox3, CN II-XII intact, normal gait Skin:  Warm, no lesions/ rash   Wt Readings from Last 3 Encounters:  11/12/20 145 lb (65.8 kg)  10/13/20 147 lb 3.2 oz (66.8 kg)  08/29/20 154 lb (69.9 kg)    Lab Results  Component Value Date   WBC 5.5 10/13/2020   HGB 15.7 (H) 10/13/2020   HCT 47.8 (H) 10/13/2020   PLT 319 10/13/2020   GLUCOSE 82 10/13/2020   CHOL 210 (H) 10/13/2020   TRIG 206 (H) 10/13/2020   HDL 68 10/13/2020   LDLCALC 110 (H) 10/13/2020   ALT 11 10/13/2020   AST 26 10/13/2020   NA 138 10/13/2020   K 4.4 10/13/2020   CL 98 10/13/2020   CREATININE 0.67 10/13/2020   BUN 8 10/13/2020   CO2 26 10/13/2020   TSH 1.15 10/13/2020   HGBA1C 5.1 10/13/2020    Assessment/Plan:  Impacted cerumen of left ear -Consulting.  Irrigated.  Patient tolerated procedure. -Consider Debrox gtts -Given handout  Thrombocytosis  - Plan: CBC (no diff)  Hypercalcemia  -was 10.6 on 10/13/20.  Will recheck. - Plan: BASIC METABOLIC PANEL WITH GFR  F/u prn in the next few months.  Can recheck Vit D in 8-12 wks after starting supplement.  Abbe Amsterdam, MD

## 2020-11-12 NOTE — Patient Instructions (Addendum)
Your vitamin D was checked last month and was low normal. You should consider taking a multivitamin with at least 1000-2000 IUs of vitamin D daily. We will wait to recheck this level. Earwax Buildup, Adult The ears produce a substance called earwax that helps keep bacteria out of the ear and protects the skin in the ear canal. Occasionally, earwax can build up in the ear and cause discomfort or hearing loss. What increases the risk? This condition is more likely to develop in people who:  Are female.  Are elderly.  Naturally produce more earwax.  Clean their ears often with cotton swabs.  Use earplugs often.  Use in-ear headphones often.  Wear hearing aids.  Have narrow ear canals.  Have earwax that is overly thick or sticky.  Have eczema.  Are dehydrated.  Have excess hair in the ear canal. What are the signs or symptoms? Symptoms of this condition include:  Reduced or muffled hearing.  A feeling of fullness in the ear or feeling that the ear is plugged.  Fluid coming from the ear.  Ear pain.  Ear itch.  Ringing in the ear.  Coughing.  An obvious piece of earwax that can be seen inside the ear canal. How is this diagnosed? This condition may be diagnosed based on:  Your symptoms.  Your medical history.  An ear exam. During the exam, your health care provider will look into your ear with an instrument called an otoscope. You may have tests, including a hearing test. How is this treated? This condition may be treated by:  Using ear drops to soften the earwax.  Having the earwax removed by a health care provider. The health care provider may: ? Flush the ear with water. ? Use an instrument that has a loop on the end (curette). ? Use a suction device.  Surgery to remove the wax buildup. This may be done in severe cases. Follow these instructions at home:   Take over-the-counter and prescription medicines only as told by your health care provider.  Do  not put any objects, including cotton swabs, into your ear. You can clean the opening of your ear canal with a washcloth or facial tissue.  Follow instructions from your health care provider about cleaning your ears. Do not over-clean your ears.  Drink enough fluid to keep your urine clear or pale yellow. This will help to thin the earwax.  Keep all follow-up visits as told by your health care provider. If earwax builds up in your ears often or if you use hearing aids, consider seeing your health care provider for routine, preventive ear cleanings. Ask your health care provider how often you should schedule your cleanings.  If you have hearing aids, clean them according to instructions from the manufacturer and your health care provider. Contact a health care provider if:  You have ear pain.  You develop a fever.  You have blood, pus, or other fluid coming from your ear.  You have hearing loss.  You have ringing in your ears that does not go away.  Your symptoms do not improve with treatment.  You feel like the room is spinning (vertigo). Summary  Earwax can build up in the ear and cause discomfort or hearing loss.  The most common symptoms of this condition include reduced or muffled hearing and a feeling of fullness in the ear or feeling that the ear is plugged.  This condition may be diagnosed based on your symptoms, your medical history, and  an ear exam.  This condition may be treated by using ear drops to soften the earwax or by having the earwax removed by a health care provider.  Do not put any objects, including cotton swabs, into your ear. You can clean the opening of your ear canal with a washcloth or facial tissue. This information is not intended to replace advice given to you by your health care provider. Make sure you discuss any questions you have with your health care provider. Document Revised: 11/04/2017 Document Reviewed: 02/02/2017 Elsevier Patient Education   2020 [employer].

## 2020-11-13 LAB — BASIC METABOLIC PANEL WITH GFR
BUN: 10 mg/dL (ref 7–25)
CO2: 30 mmol/L (ref 20–32)
Calcium: 10.1 mg/dL (ref 8.6–10.2)
Chloride: 104 mmol/L (ref 98–110)
Creat: 0.66 mg/dL (ref 0.50–1.10)
GFR, Est African American: 130 mL/min/{1.73_m2} (ref >=60)
GFR, Est Non African American: 112 mL/min/{1.73_m2} (ref >=60)
Glucose, Bld: 88 mg/dL (ref 65–99)
Potassium: 5.9 mmol/L — ABNORMAL HIGH (ref 3.5–5.3)
Sodium: 139 mmol/L (ref 135–146)

## 2020-11-13 LAB — CBC
HCT: 43.7 % (ref 35.0–45.0)
Hemoglobin: 14.4 g/dL (ref 11.7–15.5)
MCH: 30.2 pg (ref 27.0–33.0)
MCHC: 33 g/dL (ref 32.0–36.0)
MCV: 91.6 fL (ref 80.0–100.0)
MPV: 10.1 fL (ref 7.5–12.5)
Platelets: 271 10*3/uL (ref 140–400)
RBC: 4.77 10*6/uL (ref 3.80–5.10)
RDW: 12 % (ref 11.0–15.0)
WBC: 3 10*3/uL — ABNORMAL LOW (ref 3.8–10.8)

## 2020-11-14 ENCOUNTER — Other Ambulatory Visit: Payer: Self-pay

## 2020-11-14 ENCOUNTER — Encounter: Payer: Self-pay | Admitting: Family Medicine

## 2020-11-14 DIAGNOSIS — E875 Hyperkalemia: Secondary | ICD-10-CM

## 2020-11-19 NOTE — Telephone Encounter (Signed)
Pt is aware of Dr Salomon Fick recommendations

## 2020-11-21 ENCOUNTER — Other Ambulatory Visit: Payer: Self-pay | Admitting: Family Medicine

## 2020-11-24 ENCOUNTER — Encounter: Payer: Self-pay | Admitting: Family Medicine

## 2020-11-24 ENCOUNTER — Other Ambulatory Visit: Payer: BC Managed Care – PPO

## 2020-11-25 DIAGNOSIS — F438 Other reactions to severe stress: Secondary | ICD-10-CM | POA: Diagnosis not present

## 2020-11-25 MED ORDER — OMEPRAZOLE 40 MG PO CPDR
40.0000 mg | DELAYED_RELEASE_CAPSULE | Freq: Every day | ORAL | 3 refills | Status: DC
Start: 2020-11-25 — End: 2021-08-27

## 2020-12-01 ENCOUNTER — Other Ambulatory Visit: Payer: BC Managed Care – PPO

## 2020-12-01 ENCOUNTER — Other Ambulatory Visit: Payer: Self-pay

## 2020-12-01 DIAGNOSIS — E875 Hyperkalemia: Secondary | ICD-10-CM

## 2020-12-01 LAB — BASIC METABOLIC PANEL WITH GFR
BUN: 13 mg/dL (ref 7–25)
CO2: 26 mmol/L (ref 20–32)
Calcium: 9.3 mg/dL (ref 8.6–10.2)
Chloride: 102 mmol/L (ref 98–110)
Creat: 0.66 mg/dL (ref 0.50–1.10)
GFR, Est African American: 130 mL/min/{1.73_m2} (ref >=60)
GFR, Est Non African American: 112 mL/min/{1.73_m2} (ref >=60)
Glucose, Bld: 90 mg/dL (ref 65–99)
Potassium: 4.1 mmol/L (ref 3.5–5.3)
Sodium: 137 mmol/L (ref 135–146)

## 2021-04-03 ENCOUNTER — Encounter: Payer: Self-pay | Admitting: Family Medicine

## 2021-04-25 ENCOUNTER — Encounter: Payer: Self-pay | Admitting: Family Medicine

## 2021-08-27 ENCOUNTER — Other Ambulatory Visit: Payer: Self-pay | Admitting: Family Medicine

## 2021-09-02 ENCOUNTER — Other Ambulatory Visit: Payer: Self-pay

## 2021-09-02 ENCOUNTER — Encounter: Payer: Self-pay | Admitting: Obstetrics and Gynecology

## 2021-09-02 ENCOUNTER — Ambulatory Visit (INDEPENDENT_AMBULATORY_CARE_PROVIDER_SITE_OTHER): Payer: Managed Care, Other (non HMO) | Admitting: Obstetrics and Gynecology

## 2021-09-02 VITALS — BP 110/70 | HR 76 | Ht 65.0 in | Wt 159.0 lb

## 2021-09-02 DIAGNOSIS — B373 Candidiasis of vulva and vagina: Secondary | ICD-10-CM | POA: Diagnosis not present

## 2021-09-02 DIAGNOSIS — N9089 Other specified noninflammatory disorders of vulva and perineum: Secondary | ICD-10-CM

## 2021-09-02 DIAGNOSIS — R1031 Right lower quadrant pain: Secondary | ICD-10-CM | POA: Insufficient documentation

## 2021-09-02 DIAGNOSIS — B3731 Acute candidiasis of vulva and vagina: Secondary | ICD-10-CM

## 2021-09-02 DIAGNOSIS — R197 Diarrhea, unspecified: Secondary | ICD-10-CM | POA: Insufficient documentation

## 2021-09-02 DIAGNOSIS — K5900 Constipation, unspecified: Secondary | ICD-10-CM | POA: Insufficient documentation

## 2021-09-02 DIAGNOSIS — N898 Other specified noninflammatory disorders of vagina: Secondary | ICD-10-CM

## 2021-09-02 DIAGNOSIS — R1011 Right upper quadrant pain: Secondary | ICD-10-CM | POA: Insufficient documentation

## 2021-09-02 LAB — WET PREP FOR TRICH, YEAST, CLUE

## 2021-09-02 MED ORDER — BETAMETHASONE VALERATE 0.1 % EX OINT: 1.0000 "application " | TOPICAL_OINTMENT | Freq: Two times a day (BID) | CUTANEOUS | 0 refills | Status: DC

## 2021-09-02 MED ORDER — FLUCONAZOLE 150 MG PO TABS: 150.0000 mg | ORAL_TABLET | Freq: Once | ORAL | 0 refills | Status: AC

## 2021-09-02 NOTE — Progress Notes (Signed)
GYNECOLOGY  VISIT   HPI: 40 y.o.   Single White or Caucasian Not Hispanic or Latino  female   G2P0020 with Patient's last menstrual period was 08/03/2021.   here for vaginal pain and itching present for about a month. On the left side of the vulva. Symptoms started after she had been really hot and sweaty. No abnormal vaginal discharge.   Her partner of 8 years died last November 05, 2023 suddenly with a brain aneurysm. She has good support. Going to counseling.   She has cut back on ETOH use, only drinking one day a week. Will have 5 drinks that one day.   Quit smoking.   GYNECOLOGIC HISTORY: Patient's last menstrual period was 08/03/2021. Contraception:not sexually active  Menopausal hormone therapy: none         OB History     Gravida  2   Para  0   Term      Preterm      AB  2   Living  0      SAB      IAB      Ectopic      Multiple      Live Births                 Patient Active Problem List   Diagnosis Date Noted   History of depression 08/18/2018   Cigarette nicotine dependence without complication 08/18/2018   Asthmatic bronchitis , chronic (HCC) 01/06/2014   GERD (gastroesophageal reflux disease) 01/06/2014   TOBACCO USER 01/26/2010   SINUSITIS- ACUTE-NOS 02/13/2008   SORE THROAT 02/13/2008   AMENORRHEA 12/14/2007    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     Omega-3 Fatty Acids (FISH OIL)  1000 MG CAPS      omeprazole (PRILOSEC) 40 MG capsule TAKE 1 CAPSULE(40 MG) BY MOUTH DAILY 90 capsule 1   Vitamin D, Cholecalciferol, 50 MCG (2000 UT) CAPS Take 1 capsule by mouth daily.     No current facility-administered medications for this visit.     ALLERGIES: Patient has no known allergies.  Family History  Problem Relation Age of Onset   Depression Mother    Lung cancer Father 71   Cancer Father    Depression Father    Diabetes Father    Heart disease Father    Hyperlipidemia Father    Hypertension Father    Colon cancer Maternal Grandmother    Arthritis Maternal Grandmother    Cancer - Other Maternal Grandfather        stomache   Leukemia Sister 77       CML   Cancer Sister    Depression Sister    Mental illness Sister    Miscarriages / Stillbirths Sister    Diabetes Paternal Grandmother  Social History   Socioeconomic History   Marital status: Single    Spouse name: Not on file   Number of children: Not on file   Years of education: Not on file   Highest education level: Not on file  Occupational History   Not on file  Tobacco Use   Smoking status: Former    Packs/day: 0.50    Years: 15.00    Pack years: 7.50    Types: Cigarettes    Quit date: 01/06/2021    Years since quitting: 0.6   Smokeless tobacco: Never  Vaping Use   Vaping Use: Never used  Substance and Sexual Activity   Alcohol use: Yes    Alcohol/week: 24.0 standard drinks    Types: 24 Standard drinks or equivalent per week   Drug use: No   Sexual activity: Not Currently    Partners: Male    Birth control/protection: Condom  Other Topics Concern   Not on file  Social History Narrative   Not on file   Social Determinants of Health   Financial Resource Strain: Not on file  Food Insecurity: Not on file  Transportation Needs: Not on file  Physical Activity: Not on file  Stress: Not on file  Social Connections: Not on file  Intimate Partner Violence: Not on file    Review of  Systems  Genitourinary:        Vulva pain and itching.   All other systems reviewed and are negative.  PHYSICAL EXAMINATION:    BP 110/70   Pulse 76   Ht 5\' 5"  (1.651 m)   Wt 159 lb (72.1 kg)   LMP 08/03/2021   SpO2 100%   BMI 26.46 kg/m     General appearance: alert, cooperative and appears stated age  Pelvic: External genitalia:  focal area of erythema on the left labia majora (~1cm), no lesions, no fissures, no plaques, no whitening.               Urethra:  normal appearing urethra with no masses, tenderness or lesions              Bartholins and Skenes: normal                 Vagina: normal appearing vagina with a slight increase in creamy, white vaginal discharge              Cervix: no lesions               Chaperone was present for exam.  1. Vulvar irritation Focal area of irritation - WET PREP FOR TRICH, YEAST, CLUE - betamethasone valerate ointment (VALISONE) 0.1 %; Apply 1 application topically 2 (two) times daily.  Dispense: 30 g; Refill: 0  2. Vaginal discharge - WET PREP FOR TRICH, YEAST, CLUE  3. Yeast vaginitis - fluconazole (DIFLUCAN) 150 MG tablet; Take 1 tablet (150 mg total) by mouth once for 1 dose. Take one tablet.  Repeat in 72 hours if symptoms are not completely resolved.  Dispense: 2 tablet; Refill: 0

## 2021-09-02 NOTE — Patient Instructions (Signed)
Vaginal Yeast Infection, Adult °Vaginal yeast infection is a condition that causes vaginal discharge as well as soreness, swelling, and redness (inflammation) of the vagina. This is a common condition. Some women get this infection frequently. °What are the causes? °This condition is caused by a change in the normal balance of the yeast (Candida) and normal bacteria that live in the vagina. This change causes an overgrowth of yeast, which causes the inflammation. °What increases the risk? °The condition is more likely to develop in women who: °Take antibiotic medicines. °Have diabetes. °Take birth control pills. °Are pregnant. °Douche often. °Have a weak body defense system (immune system). °Have been taking steroid medicines for a long time. °Frequently wear tight clothing. °What are the signs or symptoms? °Symptoms of this condition include: °White, thick, creamy vaginal discharge. °Swelling, itching, redness, and irritation of the vagina. The lips of the vagina (labia) may be affected as well. °Pain or a burning feeling while urinating. °Pain during sex. °How is this diagnosed? °This condition is diagnosed based on: °Your medical history. °A physical exam. °A pelvic exam. Your health care provider will examine a sample of your vaginal discharge under a microscope. Your health care provider may send this sample for testing to confirm the diagnosis. °How is this treated? °This condition is treated with medicine. Medicines may be over-the-counter or prescription. You may be told to use one or more of the following: °Medicine that is taken by mouth (orally). °Medicine that is applied as a cream (topically). °Medicine that is inserted directly into the vagina (suppository). °Follow these instructions at home: °Take or apply over-the-counter and prescription medicines only as told by your health care provider. °Do not use tampons until your health care provider approves. °Do not have sex until your infection has  cleared. Sex can prolong or worsen your symptoms of infection. Ask your health care provider when it is safe to resume sexual activity. °Keep all follow-up visits. This is important. °How is this prevented? ° °Do not wear tight clothes, such as pantyhose or tight pants. °Wear breathable cotton underwear. °Do not use douches, perfumed soap, creams, or powders. °Wipe from front to back after using the toilet. °If you have diabetes, keep your blood sugar levels under control. °Ask your health care provider for other ways to prevent yeast infections. °Contact a health care provider if: °You have a fever. °Your symptoms go away and then return. °Your symptoms do not get better with treatment. °Your symptoms get worse. °You have new symptoms. °You develop blisters in or around your vagina. °You have blood coming from your vagina and it is not your menstrual period. °You develop pain in your abdomen. °Summary °Vaginal yeast infection is a condition that causes discharge as well as soreness, swelling, and redness (inflammation) of the vagina. °This condition is treated with medicine. Medicines may be over-the-counter or prescription. °Take or apply over-the-counter and prescription medicines only as told by your health care provider. °Do not douche. Resume sexual activity or use of tampons as instructed by your health care provider. °Contact a health care provider if your symptoms do not get better with treatment or your symptoms go away and then return. °This information is not intended to replace advice given to you by your health care provider. Make sure you discuss any questions you have with your health care provider. °Document Revised: 02/09/2021 Document Reviewed: 02/09/2021 °Elsevier Patient Education © 2022 Elsevier Inc. ° °

## 2021-09-10 ENCOUNTER — Ambulatory Visit: Payer: BC Managed Care – PPO | Admitting: Obstetrics and Gynecology

## 2021-10-13 ENCOUNTER — Other Ambulatory Visit: Payer: Self-pay

## 2021-10-14 ENCOUNTER — Encounter: Payer: Self-pay | Admitting: Family Medicine

## 2021-10-14 ENCOUNTER — Other Ambulatory Visit: Payer: Self-pay | Admitting: Family Medicine

## 2021-10-14 ENCOUNTER — Ambulatory Visit (INDEPENDENT_AMBULATORY_CARE_PROVIDER_SITE_OTHER): Payer: Managed Care, Other (non HMO) | Admitting: Family Medicine

## 2021-10-14 VITALS — BP 118/88 | HR 80 | Temp 98.1°F | Ht 65.0 in | Wt 158.0 lb

## 2021-10-14 DIAGNOSIS — F1011 Alcohol abuse, in remission: Secondary | ICD-10-CM | POA: Diagnosis not present

## 2021-10-14 DIAGNOSIS — E559 Vitamin D deficiency, unspecified: Secondary | ICD-10-CM | POA: Diagnosis not present

## 2021-10-14 DIAGNOSIS — E782 Mixed hyperlipidemia: Secondary | ICD-10-CM | POA: Diagnosis not present

## 2021-10-14 DIAGNOSIS — Z1159 Encounter for screening for other viral diseases: Secondary | ICD-10-CM

## 2021-10-14 DIAGNOSIS — Z87891 Personal history of nicotine dependence: Secondary | ICD-10-CM

## 2021-10-14 DIAGNOSIS — Z0000 Encounter for general adult medical examination without abnormal findings: Secondary | ICD-10-CM | POA: Diagnosis not present

## 2021-10-14 LAB — CBC WITH DIFFERENTIAL/PLATELET
Basophils Absolute: 0 10*3/uL (ref 0.0–0.1)
Basophils Relative: 0.4 % (ref 0.0–3.0)
Eosinophils Absolute: 0 10*3/uL (ref 0.0–0.7)
Eosinophils Relative: 0.6 % (ref 0.0–5.0)
HCT: 41.8 % (ref 36.0–46.0)
Hemoglobin: 13.7 g/dL (ref 12.0–15.0)
Lymphocytes Relative: 21.7 % (ref 12.0–46.0)
Lymphs Abs: 1.2 10*3/uL (ref 0.7–4.0)
MCHC: 32.9 g/dL (ref 30.0–36.0)
MCV: 90.2 fl (ref 78.0–100.0)
Monocytes Absolute: 0.4 10*3/uL (ref 0.1–1.0)
Monocytes Relative: 7.9 % (ref 3.0–12.0)
Neutro Abs: 3.9 10*3/uL (ref 1.4–7.7)
Neutrophils Relative %: 69.4 % (ref 43.0–77.0)
Platelets: 278 10*3/uL (ref 150.0–400.0)
RBC: 4.63 Mil/uL (ref 3.87–5.11)
RDW: 13.5 % (ref 11.5–15.5)
WBC: 5.7 10*3/uL (ref 4.0–10.5)

## 2021-10-14 LAB — COMPREHENSIVE METABOLIC PANEL
ALT: 12 U/L (ref 0–35)
AST: 20 U/L (ref 0–37)
Albumin: 4.5 g/dL (ref 3.5–5.2)
Alkaline Phosphatase: 53 U/L (ref 39–117)
BUN: 10 mg/dL (ref 6–23)
CO2: 28 mEq/L (ref 19–32)
Calcium: 9.5 mg/dL (ref 8.4–10.5)
Chloride: 99 mEq/L (ref 96–112)
Creatinine, Ser: 0.62 mg/dL (ref 0.40–1.20)
GFR: 112.45 mL/min (ref >60.00)
Glucose, Bld: 81 mg/dL (ref 70–99)
Potassium: 3.9 mEq/L (ref 3.5–5.1)
Sodium: 138 mEq/L (ref 135–145)
Total Bilirubin: 0.7 mg/dL (ref 0.2–1.2)
Total Protein: 7.3 g/dL (ref 6.0–8.3)

## 2021-10-14 LAB — TSH: TSH: 1.42 u[IU]/mL (ref 0.35–5.50)

## 2021-10-14 LAB — LIPID PANEL
Cholesterol: 174 mg/dL (ref 0–200)
HDL: 70.3 mg/dL (ref >39.00)
LDL Cholesterol: 64 mg/dL (ref 0–99)
NonHDL: 103.61
Total CHOL/HDL Ratio: 2
Triglycerides: 197 mg/dL — ABNORMAL HIGH (ref 0.0–149.0)
VLDL: 39.4 mg/dL (ref 0.0–40.0)

## 2021-10-14 LAB — T4, FREE: Free T4: 0.99 ng/dL (ref 0.60–1.60)

## 2021-10-14 LAB — VITAMIN D 25 HYDROXY (VIT D DEFICIENCY, FRACTURES): VITD: 26.76 ng/mL — ABNORMAL LOW (ref 30.00–100.00)

## 2021-10-14 LAB — HEMOGLOBIN A1C: Hgb A1c MFr Bld: 5.5 % (ref 4.6–6.5)

## 2021-10-14 MED ORDER — VITAMIN D (ERGOCALCIFEROL) 1.25 MG (50000 UNIT) PO CAPS: 50000.0000 [IU] | ORAL_CAPSULE | ORAL | 0 refills | Status: DC

## 2021-10-14 NOTE — Progress Notes (Signed)
Subjective:     Heather Mclean is a 40 y.o. female and is here for a comprehensive physical exam. Pt states she is doing well, keeping busy at work.  The patient reports quitting smoking x 10 months.  Pt also cut down on drinking.  Now having 5-6 drinks per wk.  Pt also walking her dog regularly for exercise.  Followed by OB/GYN, Pap up-to-date.  Social History   Socioeconomic History   Marital status: Single    Spouse name: Not on file   Number of children: Not on file   Years of education: Not on file   Highest education level: Not on file  Occupational History   Not on file  Tobacco Use   Smoking status: Former    Packs/day: 0.50    Years: 15.00    Pack years: 7.50    Types: Cigarettes    Quit date: 01/06/2021    Years since quitting: 0.7   Smokeless tobacco: Never  Vaping Use   Vaping Use: Never used  Substance and Sexual Activity   Alcohol use: Yes    Alcohol/week: 24.0 standard drinks    Types: 24 Standard drinks or equivalent per week   Drug use: No   Sexual activity: Not Currently    Partners: Male    Birth control/protection: Condom  Other Topics Concern   Not on file  Social History Narrative   Not on file   Social Determinants of Health   Financial Resource Strain: Not on file  Food Insecurity: Not on file  Transportation Needs: Not on file  Physical Activity: Not on file  Stress: Not on file  Social Connections: Not on file  Intimate Partner Violence: Not on file   Health Maintenance  Topic Date Due   Pneumococcal Vaccine 32-49 Years old (1 - PCV) Never done   Hepatitis C Screening  Never done   COVID-19 Vaccine (4 - Booster for Pfizer series) 01/03/2021   INFLUENZA VACCINE  03/05/2022 (Originally 07/06/2021)   PAP SMEAR-Modifier  08/30/2023   TETANUS/TDAP  12/07/2023   HIV Screening  Completed   HPV VACCINES  Aged Out    The following portions of the patient's history were reviewed and updated as appropriate: allergies, current medications,  past family history, past medical history, past social history, past surgical history, and problem list.  Review of Systems Pertinent items noted in HPI and remainder of comprehensive ROS otherwise negative.   Objective:    BP 118/88 (BP Location: Left Arm, Patient Position: Sitting, Cuff Size: Normal)   Pulse 80   Temp 98.1 F (36.7 C) (Oral)   Ht 5\' 5"  (1.651 m)   Wt 158 lb (71.7 kg)   LMP 10/03/2021   SpO2 99%   BMI 26.29 kg/m  General appearance: alert, cooperative, and no distress Head: Normocephalic, without obvious abnormality, atraumatic Eyes: conjunctivae/corneas clear. PERRL, EOM's intact. Fundi benign. Ears: normal TM's and external ear canals both ears Nose: Nares normal. Septum midline. Mucosa normal. No drainage or sinus tenderness. Throat: lips, mucosa, and tongue normal; teeth and gums normal Neck: no adenopathy, no carotid bruit, no JVD, supple, symmetrical, trachea midline, and thyroid not enlarged, symmetric, no tenderness/mass/nodules Lungs: clear to auscultation bilaterally Heart: regular rate and rhythm, S1, S2 normal, no murmur, click, rub or gallop Abdomen: soft, non-tender; bowel sounds normal; no masses,  no organomegaly Extremities: extremities normal, atraumatic, no cyanosis or edema Pulses: 2+ and symmetric Skin: Skin color, texture, turgor normal. No rashes or lesions Lymph nodes:  Cervical, supraclavicular, and axillary nodes normal. Neurologic: Alert and oriented X 3, normal strength and tone. Normal symmetric reflexes. Normal coordination and gait    Assessment:    Healthy female exam.    Plan:    Anticipatory guidance given including wearing seatbelts, smoke detectors in the home, increasing physical activity, increasing p.o. intake of water and vegetables. -will obtain labs -immunizations reviewed.  Patient declines influenza vaccine this visit -Discussed age for colonoscopy -Mammogram to start next year at age 49. -Continue follow-up with  OB/GYN for Pap -Given handout -Next CPE 1 year See After Visit Summary for Counseling Recommendations   Former smoker -Patient congratulated on quitting smoking x 10 months -Continued smoking cessation encouraged - Plan: CBC with Differential/Platelet  H/O ETOH abuse  -Reports cutting down on alcohol intake -Cessation encouraged - Plan: CMP, Hep C Antibody  Mixed hyperlipidemia -Total cholesterol and triglycerides elevated on 10/14/2019 -Lifestyle modifications encouraged -Given handout - Plan: Hemoglobin A1c, Lipid panel  Need for hepatitis C screening test - Plan: Hep C Antibody  Vitamin D deficiency -Devious history of vitamin D deficiency -will recheck level this visit - Plan: Vitamin D, 25-hydroxy  Follow-up as needed  Abbe Amsterdam, MD

## 2021-10-15 LAB — HEPATITIS C ANTIBODY
Hepatitis C Ab: NONREACTIVE
SIGNAL TO CUT-OFF: 0.05 (ref <1.00)

## 2021-11-23 NOTE — Progress Notes (Signed)
40 y.o. G81P0020 Single White or Caucasian Not Hispanic or Latino female here for annual exam.  She has a new partner and would like to talk about birth control options.  She would also like STD testing. She has gotten a sharp pain in her abdomen when she is masturbating at the time of orgasm, it has happened every time she has had an orgasm in the last few months. No dyspareunia. Not using condoms. She quit smoking in 1/22.    Period Cycle (Days): 28 Period Duration (Days): 3 Period Pattern: Regular Menstrual Flow: Heavy Menstrual Control: Thin pad Menstrual Control Change Freq (Hours): 4-6 Dysmenorrhea: (!) Moderate Dysmenorrhea Symptoms: Cramping  Partner of 8 years died suddenly in 11/19/2023. She is in a new relationship with a long term friend.   Patient's last menstrual period was 11/24/2021.         Sexually active: Yes.    The current method of family planning is W/D.    Exercising: Yes.     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 Her smoking use included cigarettes. She has a 7.50 pack-year smoking history. She has never used smokeless tobacco. She reports current alcohol use of about 24.0 standard drinks per week. She reports that she does not use drugs. She is drinking only one night a week, has 6-7 drinks that one night.   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   Alcohol addiction (HCC)    AMENORRHEA    Anxiety    Depression    Eating disorder    GERD (gastroesophageal reflux disease)    HPV (human papilloma virus) anogenital infection Age 42   SINUSITIS- ACUTE-NOS    SORE THROAT    TOBACCO USER     Past Surgical History:   Procedure Laterality Date   COLPOSCOPY  Age 46   LEEP  2001   CIN I, CIN II   WISDOM TOOTH EXTRACTION  Age 63    Current Outpatient Medications  Medication Sig Dispense Refill   betamethasone valerate ointment (VALISONE) 0.1 % Apply 1 application topically 2 (two) times daily. 30 g 0   Multiple Vitamins-Minerals (MULTIVITAMIN PO) Take by mouth.     Omega-3 Fatty Acids (FISH OIL) 1000 MG CAPS      omeprazole (PRILOSEC) 40 MG capsule TAKE 1 CAPSULE(40 MG) BY MOUTH DAILY 90 capsule 1   Vitamin D, Ergocalciferol, (DRISDOL) 1.25 MG (50000 UNIT) CAPS capsule Take 1 capsule (50,000 Units total) by mouth every 7 (seven) days. 12 capsule 0   No current facility-administered medications for this visit.    Family History  Problem Relation Age of Onset   Depression Mother    Lung cancer Father 81   Cancer Father    Depression Father    Diabetes Father    Heart disease Father    Hyperlipidemia Father    Hypertension Father    Colon cancer Maternal Grandmother    Arthritis Maternal Grandmother    Cancer - Other Maternal Grandfather        stomache  Leukemia Sister 53       CML   Cancer Sister    Depression Sister    Mental illness Sister    Miscarriages / Stillbirths Sister    Diabetes Paternal Grandmother     Review of Systems  All other systems reviewed and are negative.  Exam:   BP 130/72    Pulse 67    Ht 5\' 5"  (1.651 m)    Wt 158 lb (71.7 kg)    LMP 11/24/2021    SpO2 99%    BMI 26.29 kg/m   Weight change: @WEIGHTCHANGE @ Height:   Height: 5\' 5"  (165.1 cm)  Ht Readings from Last 3 Encounters:  11/26/21 5\' 5"  (1.651 m)  10/14/21 5\' 5"  (1.651 m)  09/02/21 5\' 5"  (1.651 m)    General appearance: alert, cooperative and appears stated age Head: Normocephalic, without obvious abnormality, atraumatic Neck: no adenopathy, supple, symmetrical, trachea midline and thyroid normal to inspection and palpation Lungs: clear to auscultation bilaterally Cardiovascular: regular rate  and rhythm Breasts: normal appearance, no masses or tenderness Abdomen: soft, non-tender; non distended,  no masses,  no organomegaly Extremities: extremities normal, atraumatic, no cyanosis or edema Skin: Skin color, texture, turgor normal. No rashes or lesions Lymph nodes: Cervical, supraclavicular, and axillary nodes normal. No abnormal inguinal nodes palpated Neurologic: Grossly normal   Pelvic: External genitalia:  no lesions              Urethra:  normal appearing urethra with no masses, tenderness or lesions              Bartholins and Skenes: normal                 Vagina: normal appearing vagina with normal color and discharge, no lesions              Cervix: no lesions               Bimanual Exam:  Uterus:  normal size, contour, position, consistency, mobility, non-tender              Adnexa: no mass, fullness, tenderness               Rectovaginal: Confirms               Anus:  normal sphincter tone, no lesions  11/28/21 chaperoned for the exam.  1. Well woman exam No pap this year Mammogram next year Labs UTD  2. Immunization counseling Start the gardasil series - HPV 9-valent vaccine,Recombinat  3. Screening examination for STD (sexually transmitted disease) - RPR - HIV Antibody (routine testing w rflx) - SURESWAB CT/NG/T. vaginalis  4. General counseling and advice on female contraception Discussed options, wants to start ocp's, no contraindications, risks reviewed. - norethindrone-ethinyl estradiol-FE (LOESTRIN FE) 1-20 MG-MCG tablet; Take 1 tablet by mouth daily.  Dispense: 84 tablet; Refill: 0

## 2021-11-26 ENCOUNTER — Other Ambulatory Visit: Payer: Self-pay

## 2021-11-26 ENCOUNTER — Encounter: Payer: Self-pay | Admitting: Obstetrics and Gynecology

## 2021-11-26 ENCOUNTER — Ambulatory Visit (INDEPENDENT_AMBULATORY_CARE_PROVIDER_SITE_OTHER): Payer: Managed Care, Other (non HMO) | Admitting: Obstetrics and Gynecology

## 2021-11-26 VITALS — BP 130/72 | HR 67 | Ht 65.0 in | Wt 158.0 lb

## 2021-11-26 DIAGNOSIS — Z3009 Encounter for other general counseling and advice on contraception: Secondary | ICD-10-CM | POA: Diagnosis not present

## 2021-11-26 DIAGNOSIS — Z7185 Encounter for immunization safety counseling: Secondary | ICD-10-CM | POA: Diagnosis not present

## 2021-11-26 DIAGNOSIS — Z23 Encounter for immunization: Secondary | ICD-10-CM | POA: Diagnosis not present

## 2021-11-26 DIAGNOSIS — Z01419 Encounter for gynecological examination (general) (routine) without abnormal findings: Secondary | ICD-10-CM | POA: Diagnosis not present

## 2021-11-26 DIAGNOSIS — Z113 Encounter for screening for infections with a predominantly sexual mode of transmission: Secondary | ICD-10-CM

## 2021-11-26 MED ORDER — NORETHIN ACE-ETH ESTRAD-FE 1-20 MG-MCG PO TABS: 1.0000 | ORAL_TABLET | Freq: Every day | ORAL | 0 refills | Status: DC

## 2021-11-26 NOTE — Patient Instructions (Addendum)
EXERCISE   We recommended that you start or continue a regular exercise program for good health. Physical activity is anything that gets your body moving, some is better than none. The CDC recommends 150 minutes per week of Moderate-Intensity Aerobic Activity and 2 or more days of Muscle Strengthening Activity. ° °Benefits of exercise are limitless: helps weight loss/weight maintenance, improves mood and energy, helps with depression and anxiety, improves sleep, tones and strengthens muscles, improves balance, improves bone density, protects from chronic conditions such as heart disease, high blood pressure and diabetes and so much more. °To learn more visit: https://www.cdc.gov/physicalactivity/index.html ° °DIET: Good nutrition starts with a healthy diet of fruits, vegetables, whole grains, and lean protein sources. Drink plenty of water for hydration. Minimize empty calories, sodium, sweets. For more information about dietary recommendations visit: https://health.gov/our-work/nutrition-physical-activity/dietary-guidelines and https://www.myplate.gov/ ° °ALCOHOL:  Women should limit their alcohol intake to no more than 7 drinks/beers/glasses of wine (combined, not each!) per week. Moderation of alcohol intake to this level decreases your risk of breast cancer and liver damage.  If you are concerned that you may have a problem, or your friends have told you they are concerned about your drinking, there are many resources to help. A well-known program that is free, effective, and available to all people all over the nation is Alcoholics Anonymous.  Check out this site to learn more: https://www.aa.org/ ° ° °CALCIUM AND VITAMIN D:  Adequate intake of calcium and Vitamin D are recommended for bone health.  You should be getting between 1000-1200 mg of calcium and 800 units of Vitamin D daily between diet and supplements ° °PAP SMEARS:  Pap smears, to check for cervical cancer or precancers,  have traditionally been  done yearly, scientific advances have shown that most women can have pap smears less often.  However, every woman still should have a physical exam from her gynecologist every year. It will include a breast check, inspection of the vulva and vagina to check for abnormal growths or skin changes, a visual exam of the cervix, and then an exam to evaluate the size and shape of the uterus and ovaries. We will also provide age appropriate advice regarding health maintenance, like when you should have certain vaccines, screening for sexually transmitted diseases, bone density testing, colonoscopy, mammograms, etc.  ° °MAMMOGRAMS:  All women over 40 years old should have a routine mammogram.  ° °COLON CANCER SCREENING: Now recommend starting at age 45. At this time colonoscopy is not covered for routine screening until 50. There are take home tests that can be done between 45-49.  ° °COLONOSCOPY:  Colonoscopy to screen for colon cancer is recommended for all women at age 50.  We know, you hate the idea of the prep.  We agree, BUT, having colon cancer and not knowing it is worse!!  Colon cancer so often starts as a polyp that can be seen and removed at colonscopy, which can quite literally save your life!  And if your first colonoscopy is normal and you have no family history of colon cancer, most women don't have to have it again for 10 years.  Once every ten years, you can do something that may end up saving your life, right?  We will be happy to help you get it scheduled when you are ready.  Be sure to check your insurance coverage so you understand how much it will cost.  It may be covered as a preventative service at no cost, but you should check   your particular policy.      Breast Self-Awareness Breast self-awareness means being familiar with how your breasts look and feel. It involves checking your breasts regularly and reporting any changes to your health care provider. Practicing breast self-awareness is  important. A change in your breasts can be a sign of a serious medical problem. Being familiar with how your breasts look and feel allows you to find any problems early, when treatment is more likely to be successful. All women should practice breast self-awareness, including women who have had breast implants. How to do a breast self-exam One way to learn what is normal for your breasts and whether your breasts are changing is to do a breast self-exam. To do a breast self-exam: Look for Changes  Remove all the clothing above your waist. Stand in front of a mirror in a room with good lighting. Put your hands on your hips. Push your hands firmly downward. Compare your breasts in the mirror. Look for differences between them (asymmetry), such as: Differences in shape. Differences in size. Puckers, dips, and bumps in one breast and not the other. Look at each breast for changes in your skin, such as: Redness. Scaly areas. Look for changes in your nipples, such as: Discharge. Bleeding. Dimpling. Redness. A change in position. Feel for Changes Carefully feel your breasts for lumps and changes. It is best to do this while lying on your back on the floor and again while sitting or standing in the shower or tub with soapy water on your skin. Feel each breast in the following way: Place the arm on the side of the breast you are examining above your head. Feel your breast with the other hand. Start in the nipple area and make  inch (2 cm) overlapping circles to feel your breast. Use the pads of your three middle fingers to do this. Apply light pressure, then medium pressure, then firm pressure. The light pressure will allow you to feel the tissue closest to the skin. The medium pressure will allow you to feel the tissue that is a little deeper. The firm pressure will allow you to feel the tissue close to the ribs. Continue the overlapping circles, moving downward over the breast until you feel your  ribs below your breast. Move one finger-width toward the center of the body. Continue to use the  inch (2 cm) overlapping circles to feel your breast as you move slowly up toward your collarbone. Continue the up and down exam using all three pressures until you reach your armpit.  Write Down What You Find  Write down what is normal for each breast and any changes that you find. Keep a written record with breast changes or normal findings for each breast. By writing this information down, you do not need to depend only on memory for size, tenderness, or location. Write down where you are in your menstrual cycle, if you are still menstruating. If you are having trouble noticing differences in your breasts, do not get discouraged. With time you will become more familiar with the variations in your breasts and more comfortable with the exam. How often should I examine my breasts? Examine your breasts every month. If you are breastfeeding, the best time to examine your breasts is after a feeding or after using a breast pump. If you menstruate, the best time to examine your breasts is 5-7 days after your period is over. During your period, your breasts are lumpier, and it may be more   difficult to notice changes. When should I see my health care provider? See your health care provider if you notice: A change in shape or size of your breasts or nipples. A change in the skin of your breast or nipples, such as a reddened or scaly area. Unusual discharge from your nipples. A lump or thick area that was not there before.Oral Contraception Information Oral contraceptive pills (OCPs) are medicines taken by mouth to prevent pregnancy. They work by: Preventing the ovaries from releasing eggs. Thickening mucus in the lower part of the uterus (cervix). This prevents sperm from entering the uterus. Thinning the lining of the uterus (endometrium). This prevents a fertilized egg from attaching to the  endometrium. OCPs are highly effective when taken exactly as prescribed. However, OCPs do not prevent STIs (sexually transmitted infections). Using condoms while on an OCP can help prevent STIs. What happens before starting OCPs? Before you start taking OCPs: You may have a physical exam, blood test, and Pap test. Your health care provider will make sure you are a good candidate for oral contraception. OCPs are not a good option for certain women, such as: Women who smoke and are older than age 72. Women who have or have had certain conditions, such as: A history of high blood pressure. Deep vein thrombosis. Pulmonary embolism. Stroke. Cardiovascular disease. Peripheral vascular disease. Ask your health care provider about the possible side effects of the OCP you may be prescribed. Be aware that it can take 2-3 months for your body to adjust to changes in hormone levels. Types of oral contraception Birth control pills contain the hormones estrogen and progestin (synthetic progesterone) or progestin only. The combination pill This type of pill contains estrogen and progestin hormones. Conventional contraception pills come in packs of 21 or 28 pills. Some packs with 28-day pills contain estrogen and progestin for the first 21-24 days. Hormone-free tablets, called placebos, are taken for the final 4-7 days. You should have menstrual bleeding during the time you take the placebos. In packs with 21 tablets, you take no pills for 7 days. Menstrual bleeding occurs during these days. (Some people prefer taking a pill for 28 days to help establish a routine). Extended-interval contraception pills come in packs of 91 pills. The first 84 tablets have both estrogen and progestin. The last 7 pills are placebos. Menstrual bleeding occurs during the placebo days. With this schedule, menstrual bleeding happens once every 3 months. Continuous contraception pills come in packs of 28 pills. All pills in the pack  contain estrogen and progestin. With this schedule, regular menstrual bleeding does not happen, but there may be spotting or irregular bleeding. Progestin-only pills This type of pill is often called the mini-pill and contains the progestin hormone only. It comes in packs of 28 pills. In some packs, the last 4 pills are placebos. The pill must be taken at the same time every day. This is very important to prevent pregnancy. Menstrual bleeding may not be regular or predictable. What are the advantages? Oral contraception provides reliable and continuous contraception if taken as directed. It may treat or decrease symptoms of: Menstrual period cramps. Irregular menstrual cycle or bleeding. Heavy menstrual flow. Abnormal uterine bleeding. Acne, depending on the type of pill. Polycystic ovarian syndrome (POS). Endometriosis. Iron deficiency anemia. Premenstrual symptoms, including severe irritability, depression, or anxiety. It also may: Reduce the risk of endometrial and ovarian cancer. Be used as emergency contraception. Prevent ectopic pregnancies and infections of the fallopian tubes. What can make  OCPs less effective? OCPs may be less effective if: You forget to take the pill every day. For progestin-only pills, it is especially important to take the pill at the same time each day. Even taking it 3 hours late can increase the risk of pregnancy. You have a stomach or intestinal disease that reduces your body's ability to absorb the pill. You take OCPs with other medicines that make OCPs less effective, such as antibiotics, certain HIV medicines, and some seizure medicines. You take expired OCPs. You forget to restart the pill after 7 days of not taking it. This refers to the packs of 21 pills. What are the side effects and risks? OCPs can sometimes cause side effects, such as: Headache. Depression. Trouble sleeping. Nausea and vomiting. Breast tenderness. Irregular bleeding or  spotting during the first several months. Bloating or fluid retention. Increase in blood pressure. Combination pills may slightly increase the risk of: Blood clots. Heart attack. Stroke. Follow these instructions at home: Follow instructions from your health care provider about how to start taking your first cycle of OCPs. Depending on when you start the pill, you may need to use a backup form of birth control, such as condoms, during the first week. Make sure you know what steps to take if you forget to take the pill. Summary Oral contraceptive pills (OCPs) are medicines taken by mouth to prevent pregnancy. They are highly effective when taken exactly as prescribed. OCPs contain a combination of the hormones estrogen and progestin (synthetic progesterone) or progestin only. Before you start taking the pill, you may have a physical exam, blood test, and Pap test. Your health care provider will make sure you are a good candidate for oral contraception. The combination pill may come in a 21-day pack, a 28-day pack, or a 91-day pack. Progestin-only pills come in packs of 28 pills. OCPs can sometimes cause side effects, such as headache, nausea, breast tenderness, or irregular bleeding. This information is not intended to replace advice given to you by your health care provider. Make sure you discuss any questions you have with your health care provider. Document Revised: 08/22/2020 Document Reviewed: 07/31/2020 Elsevier Patient Education  2022 [employer]  Pain in your breasts. Anything that concerns you.

## 2021-11-27 LAB — SURESWAB CT/NG/T. VAGINALIS
C. trachomatis RNA, TMA: NOT DETECTED
N. gonorrhoeae RNA, TMA: NOT DETECTED
Trichomonas vaginalis RNA: NOT DETECTED

## 2021-11-27 LAB — RPR: RPR Ser Ql: NONREACTIVE

## 2021-11-27 LAB — HIV ANTIBODY (ROUTINE TESTING W REFLEX): HIV 1&2 Ab, 4th Generation: NONREACTIVE

## 2022-01-06 ENCOUNTER — Other Ambulatory Visit: Payer: Self-pay | Admitting: Family Medicine

## 2022-01-06 DIAGNOSIS — E559 Vitamin D deficiency, unspecified: Secondary | ICD-10-CM

## 2022-02-10 ENCOUNTER — Other Ambulatory Visit: Payer: Self-pay | Admitting: Obstetrics and Gynecology

## 2022-02-10 DIAGNOSIS — Z3009 Encounter for other general counseling and advice on contraception: Secondary | ICD-10-CM

## 2022-02-10 NOTE — Telephone Encounter (Signed)
Last AEX 11/26/21. ?

## 2022-02-10 NOTE — Telephone Encounter (Signed)
MyChart msg sent to pt:  ? ?"Good morning Heather Mclean! We are writing you in regards to a prescription refill request from [facility] today for your Blisovi. Dr. Talbert Nan went a ahead and sent in just a months worth so you wouldn't run out but she did want you to make a visit to discuss how things are doing with the medication. Can I have scheduling call you to make that appt? Thanks."  ?

## 2022-02-10 NOTE — Telephone Encounter (Signed)
She is supposed to f/u on OCP's, just started in 12/22. One pack sent, please schedule a f/u visit.  ?

## 2022-02-23 NOTE — Telephone Encounter (Signed)
FYI. Left detailed VM for pt to call back and schedule appt at her earliest convenience.  ?

## 2022-02-24 NOTE — Telephone Encounter (Signed)
Patient called back, I sent message to appointment to schedule f/u appointment. Patient scheduled on 03/25/22. ?

## 2022-03-02 ENCOUNTER — Encounter: Payer: Self-pay | Admitting: Obstetrics and Gynecology

## 2022-03-02 DIAGNOSIS — Z3009 Encounter for other general counseling and advice on contraception: Secondary | ICD-10-CM

## 2022-03-02 MED ORDER — NORETHIN ACE-ETH ESTRAD-FE 1-20 MG-MCG PO TABS: 1.0000 | ORAL_TABLET | Freq: Every day | ORAL | 0 refills | Status: DC

## 2022-03-02 NOTE — Telephone Encounter (Signed)
Appt scheduled for 03/25/22 @ 9.  ?

## 2022-03-08 DIAGNOSIS — M545 Low back pain, unspecified: Secondary | ICD-10-CM | POA: Insufficient documentation

## 2022-03-25 ENCOUNTER — Ambulatory Visit: Payer: Managed Care, Other (non HMO)

## 2022-03-25 ENCOUNTER — Encounter: Payer: Self-pay | Admitting: Obstetrics and Gynecology

## 2022-03-25 ENCOUNTER — Ambulatory Visit (INDEPENDENT_AMBULATORY_CARE_PROVIDER_SITE_OTHER): Payer: Managed Care, Other (non HMO) | Admitting: Obstetrics and Gynecology

## 2022-03-25 VITALS — BP 122/82 | HR 77 | Ht 65.0 in | Wt 155.0 lb

## 2022-03-25 DIAGNOSIS — Z23 Encounter for immunization: Secondary | ICD-10-CM | POA: Diagnosis not present

## 2022-03-25 DIAGNOSIS — R1031 Right lower quadrant pain: Secondary | ICD-10-CM | POA: Diagnosis not present

## 2022-03-25 DIAGNOSIS — Z3041 Encounter for surveillance of contraceptive pills: Secondary | ICD-10-CM | POA: Diagnosis not present

## 2022-03-25 MED ORDER — NORETHIN ACE-ETH ESTRAD-FE 1-20 MG-MCG PO TABS: 1.0000 | ORAL_TABLET | Freq: Every day | ORAL | 2 refills | Status: DC

## 2022-03-25 NOTE — Progress Notes (Signed)
GYNECOLOGY  VISIT ?  ?HPI: ?41 y.o.   Single White or Caucasian Not Hispanic or Latino  female   ?[id number] with Patient's last menstrual period was 03/15/2022 (approximate).   ?here for follow up on OCP's and 2nd gardasil. Last week at the end of her period she had lower right abdominal pressure.  She says that it has slowly gone away.  ? ?Cycles on OCP's are monthly for one day, light flow, mild cramps.  ?Prior pain with orgasm has resolved. ?Last week she was having intermittent, sharp, RLQ pain. Not currently having any pain. No bowel changes, thought it might be gas. Lasted for a week. BM are 1 x a day. No urinary c/o. No pain with intercourse during the above week with pain.  ? ?She had a negative UPT in 02/28/22 when she was seen for back pain.  ? ?GYNECOLOGIC HISTORY: ?Patient's last menstrual period was 03/15/2022 (approximate). ?Contraception:OCP ?Menopausal hormone therapy: none  ?       ?OB History   ? ? Gravida  ?2  ? Para  ?0  ? Term  ?   ? Preterm  ?   ? AB  ?2  ? Living  ?0  ?  ? ? SAB  ?   ? IAB  ?   ? Ectopic  ?   ? Multiple  ?   ? Live Births  ?   ?   ?  ?  ?    ? ?Patient Active Problem List  ? Diagnosis Date Noted  ? Constipation 09/02/2021  ? Diarrhea 09/02/2021  ? Right lower quadrant pain 09/02/2021  ? Right upper quadrant pain 09/02/2021  ? History of depression 08/18/2018  ? Cigarette nicotine dependence without complication 08/18/2018  ? Asthmatic bronchitis , chronic (HCC) 01/06/2014  ? GERD (gastroesophageal reflux disease) 01/06/2014  ? TOBACCO USER 01/26/2010  ? SINUSITIS- ACUTE-NOS 02/13/2008  ? SORE THROAT 02/13/2008  ? AMENORRHEA 12/14/2007  ? ? ?Past Medical History:  ?Diagnosis Date  ? 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 Alcohol addiction (HCC)   ? AMENORRHEA   ? Anxiety   ? Depression   ? Eating disorder   ? GERD (gastroesophageal reflux disease)   ? HPV (human papilloma virus) anogenital infection Age 41  ? SINUSITIS- ACUTE-NOS   ? SORE THROAT   ? TOBACCO USER    ? ? ?Past Surgical History:  ?Procedure Laterality Date  ? COLPOSCOPY  Age 49  ? LEEP  2001  ? CIN I, CIN II  ? WISDOM TOOTH EXTRACTION  Age 89  ? ? ?Current Outpatient Medications  ?Medication Sig Dispense Refill  ? betamethasone valerate ointment (VALISONE) 0.1 % Apply 1 application topically 2 (two) times daily. 30 g 0  ? Multiple Vitamins-Minerals (MULTIVITAMIN PO) Take by mouth.    ? norethindrone-ethinyl estradiol-FE (BLISOVI FE 1/20) 1-20 MG-MCG tablet Take 1 tablet by mouth daily. 28 tablet 0  ? Omega-3 Fatty Acids (FISH OIL) 1000 MG CAPS     ? omeprazole (PRILOSEC) 40 MG capsule TAKE 1 CAPSULE(40 MG) BY MOUTH DAILY 90 capsule 1  ? Vitamin D, Ergocalciferol, (DRISDOL) 1.25 MG (50000 UNIT) CAPS capsule Take 1 capsule (50,000 Units total) by mouth every 7 (seven) days. 12 capsule 0  ? ?No current facility-administered medications for this visit.  ?  ? ?ALLERGIES: Patient has no known allergies. ? ?Family History  ?Problem Relation Age of Onset  ? Depression Mother   ? Lung cancer Father  63  ? Cancer Father   ? Depression Father   ? Diabetes Father   ? Heart disease Father   ? Hyperlipidemia Father   ? Hypertension Father   ? Colon cancer Maternal Grandmother   ? Arthritis Maternal Grandmother   ? Cancer - Other Maternal Grandfather   ?     stomache  ? Leukemia Sister 86  ?     CML  ? Cancer Sister   ? Depression Sister   ? Mental illness Sister   ? Miscarriages / Stillbirths Sister   ? Diabetes Paternal Grandmother   ? ? ?Social History  ? ?Socioeconomic History  ? Marital status: Single  ?  Spouse name: Not on file  ? Number of children: Not on file  ? Years of education: Not on file  ? Highest education level: Not on file  ?Occupational History  ? Not on file  ?Tobacco Use  ? Smoking status: Former  ?  Packs/day: 0.50  ?  Years: 15.00  ?  Pack years: 7.50  ?  Types: Cigarettes  ?  Quit date: 01/06/2021  ?  Years since quitting: 1.2  ? Smokeless tobacco: Never  ?Vaping Use  ? Vaping Use: Never used  ?Substance  and Sexual Activity  ? Alcohol use: Yes  ?  Alcohol/week: 24.0 standard drinks  ?  Types: 24 Standard drinks or equivalent per week  ? Drug use: No  ? Sexual activity: Not Currently  ?  Partners: Male  ?  Birth control/protection: Condom  ?Other Topics Concern  ? Not on file  ?Social History Narrative  ? Not on file  ? ?Social Determinants of Health  ? ?Financial Resource Strain: Not on file  ?Food Insecurity: Not on file  ?Transportation Needs: Not on file  ?Physical Activity: Not on file  ?Stress: Not on file  ?Social Connections: Not on file  ?Intimate Partner Violence: Not on file  ? ? ?ROS ? ?PHYSICAL EXAMINATION:   ? ?BP 122/82   Pulse 77   Ht 5\' 5"  (1.651 m)   Wt 155 lb (70.3 kg)   LMP 03/15/2022 (Approximate)   SpO2 100%   BMI 25.79 kg/m?     ?General appearance: alert, cooperative and appears stated age ?Abdomen: soft, non-tender; non distended, no masses,  no organomegaly ? ?1. Encounter for surveillance of contraceptive pills ?Doing well ?- norethindrone-ethinyl estradiol-FE (BLISOVI FE 1/20) 1-20 MG-MCG tablet; Take 1 tablet by mouth daily.  Dispense: 84 tablet; Refill: 2 ? ?2. Need for HPV vaccination ?- HPV 9-valent vaccine,Recombinat ? ?3. Right lower quadrant abdominal pain ?She had a one week h/o intermittent, sharp RLQ abdominal pain. No diarrhea, constipation or urinary c/o. The pain has resolved and her abdominal exam is normal. ?-She will reach out with recurrent pain, would set up an exam and an ultrasound ?-She is concerned secondary to her MGM h/o colon cancer in her 4's. The patient will have her Mother check with her GI with recommendations for Jhene and if she needs a colonoscopy prior to 55 ? ?

## 2022-04-01 ENCOUNTER — Other Ambulatory Visit: Payer: Self-pay | Admitting: Obstetrics and Gynecology

## 2022-04-01 DIAGNOSIS — Z3041 Encounter for surveillance of contraceptive pills: Secondary | ICD-10-CM

## 2022-04-09 ENCOUNTER — Other Ambulatory Visit: Payer: Self-pay | Admitting: Family Medicine

## 2022-08-18 ENCOUNTER — Other Ambulatory Visit: Payer: Self-pay | Admitting: Family Medicine

## 2022-08-18 DIAGNOSIS — Z1231 Encounter for screening mammogram for malignant neoplasm of breast: Secondary | ICD-10-CM

## 2022-10-13 ENCOUNTER — Ambulatory Visit
Admission: RE | Admit: 2022-10-13 | Discharge: 2022-10-13 | Disposition: A | Discharge status: Home / Self Care | Payer: Managed Care, Other (non HMO) | Source: Ambulatory Visit | Attending: Family Medicine | Admitting: Family Medicine

## 2022-10-13 DIAGNOSIS — Z1231 Encounter for screening mammogram for malignant neoplasm of breast: Secondary | ICD-10-CM

## 2022-10-15 ENCOUNTER — Encounter: Payer: Self-pay | Admitting: Family Medicine

## 2022-10-15 ENCOUNTER — Ambulatory Visit (INDEPENDENT_AMBULATORY_CARE_PROVIDER_SITE_OTHER): Payer: Managed Care, Other (non HMO) | Admitting: Family Medicine

## 2022-10-15 VITALS — BP 112/82 | HR 85 | Temp 98.3°F | Ht 65.25 in | Wt 160.2 lb

## 2022-10-15 DIAGNOSIS — Z800 Family history of malignant neoplasm of digestive organs: Secondary | ICD-10-CM

## 2022-10-15 DIAGNOSIS — Z0000 Encounter for general adult medical examination without abnormal findings: Secondary | ICD-10-CM | POA: Diagnosis not present

## 2022-10-15 DIAGNOSIS — Z833 Family history of diabetes mellitus: Secondary | ICD-10-CM

## 2022-10-15 DIAGNOSIS — E559 Vitamin D deficiency, unspecified: Secondary | ICD-10-CM | POA: Diagnosis not present

## 2022-10-15 DIAGNOSIS — E781 Pure hyperglyceridemia: Secondary | ICD-10-CM

## 2022-10-15 DIAGNOSIS — K219 Gastro-esophageal reflux disease without esophagitis: Secondary | ICD-10-CM

## 2022-10-15 DIAGNOSIS — Z87891 Personal history of nicotine dependence: Secondary | ICD-10-CM

## 2022-10-15 DIAGNOSIS — Z87898 Personal history of other specified conditions: Secondary | ICD-10-CM

## 2022-10-15 LAB — COMPREHENSIVE METABOLIC PANEL
ALT: 9 U/L (ref 0–35)
AST: 17 U/L (ref 0–37)
Albumin: 4.2 g/dL (ref 3.5–5.2)
Alkaline Phosphatase: 36 U/L — ABNORMAL LOW (ref 39–117)
BUN: 10 mg/dL (ref 6–23)
CO2: 24 mEq/L (ref 19–32)
Calcium: 9.3 mg/dL (ref 8.4–10.5)
Chloride: 103 mEq/L (ref 96–112)
Creatinine, Ser: 0.7 mg/dL (ref 0.40–1.20)
GFR: 108.45 mL/min (ref >60.00)
Glucose, Bld: 88 mg/dL (ref 70–99)
Potassium: 4.2 mEq/L (ref 3.5–5.1)
Sodium: 135 mEq/L (ref 135–145)
Total Bilirubin: 0.4 mg/dL (ref 0.2–1.2)
Total Protein: 7.2 g/dL (ref 6.0–8.3)

## 2022-10-15 LAB — CBC WITH DIFFERENTIAL/PLATELET
Basophils Absolute: 0 10*3/uL (ref 0.0–0.1)
Basophils Relative: 0.2 % (ref 0.0–3.0)
Eosinophils Absolute: 0.1 10*3/uL (ref 0.0–0.7)
Eosinophils Relative: 1.1 % (ref 0.0–5.0)
HCT: 39.9 % (ref 36.0–46.0)
Hemoglobin: 13.3 g/dL (ref 12.0–15.0)
Lymphocytes Relative: 23.2 % (ref 12.0–46.0)
Lymphs Abs: 1.2 10*3/uL (ref 0.7–4.0)
MCHC: 33.4 g/dL (ref 30.0–36.0)
MCV: 90 fl (ref 78.0–100.0)
Monocytes Absolute: 0.4 10*3/uL (ref 0.1–1.0)
Monocytes Relative: 8.7 % (ref 3.0–12.0)
Neutro Abs: 3.4 10*3/uL (ref 1.4–7.7)
Neutrophils Relative %: 66.8 % (ref 43.0–77.0)
Platelets: 275 10*3/uL (ref 150.0–400.0)
RBC: 4.43 Mil/uL (ref 3.87–5.11)
RDW: 13.3 % (ref 11.5–15.5)
WBC: 5 10*3/uL (ref 4.0–10.5)

## 2022-10-15 LAB — LIPID PANEL
Cholesterol: 196 mg/dL (ref 0–200)
HDL: 59.2 mg/dL (ref >39.00)
LDL Cholesterol: 105 mg/dL — ABNORMAL HIGH (ref 0–99)
NonHDL: 136.85
Total CHOL/HDL Ratio: 3
Triglycerides: 160 mg/dL — ABNORMAL HIGH (ref 0.0–149.0)
VLDL: 32 mg/dL (ref 0.0–40.0)

## 2022-10-15 LAB — VITAMIN D 25 HYDROXY (VIT D DEFICIENCY, FRACTURES): VITD: 25.55 ng/mL — ABNORMAL LOW (ref 30.00–100.00)

## 2022-10-15 LAB — HEMOGLOBIN A1C: Hgb A1c MFr Bld: 5.2 % (ref 4.6–6.5)

## 2022-10-15 MED ORDER — OMEPRAZOLE 40 MG PO CPDR: DELAYED_RELEASE_CAPSULE | ORAL | 1 refills | Status: DC

## 2022-10-15 NOTE — Patient Instructions (Signed)
Consider obtaining the third dose of the HPV vaccine.

## 2022-10-15 NOTE — Progress Notes (Signed)
Subjective:     Heather Mclean is a 41 y.o. female and is here for a comprehensive physical exam. The patient reports doing well.  Pt no longer smoking, using Nicorette gum.  Patient has continued alcohol cessation.  Walking daily for exercise, eating spinach salads.  Requesting refill on omeprazole.  Started taking align probiotic 3 days ago.  Typically has BM every morning noticed a change having bloating and incomplete emptying sensation.  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  Patient had mammogram this week.  Inquires about colon cancer screening as her maternal grandmother died in her 24s 2/2 colon cancer.  Patient also requesting vitamin D recheck as previously low.  Social History   Socioeconomic History   Marital status: Single    Spouse name: Not on file   Number of children: Not on file   Years of education: Not on file   Highest education level: Not on file  Occupational History   Not on file  Tobacco Use   Smoking status: Former    Packs/day: 0.50    Years: 15.00    Total pack years: 7.50    Types: Cigarettes    Quit date: 01/06/2021    Years since quitting: 1.7   Smokeless tobacco: Never  Vaping Use   Vaping Use: Never used  Substance and Sexual Activity   Alcohol use: Yes    Alcohol/week: 24.0 standard drinks of alcohol    Types: 24 Standard drinks or equivalent per week   Drug use: No   Sexual activity: Not Currently    Partners: Male    Birth control/protection: Condom  Other Topics Concern   Not on file  Social History Narrative   Not on file   Social Determinants of Health   Financial Resource Strain: Not on file  Food Insecurity: Not on file  Transportation Needs: Not on file  Physical Activity: Not on file  Stress: Not on file  Social Connections: Not on file  Intimate Partner Violence: Not on file   Health Maintenance  Topic Date Due   COVID-19 Vaccine (4 - Pfizer series) 01/03/2021   HPV VACCINES (3 -  3-dose SCDM series) 07/25/2022   INFLUENZA VACCINE  03/06/2023 (Originally 07/06/2022)   PAP SMEAR-Modifier  08/30/2023   TETANUS/TDAP  12/07/2023   Hepatitis C Screening  Completed   HIV Screening  Completed    The following portions of the patient's history were reviewed and updated as appropriate: allergies, current medications, past family history, past medical history, past social history, past surgical history, and problem list.  Review of Systems Pertinent items noted in HPI and remainder of comprehensive ROS otherwise negative.   Objective:    BP 112/82 (BP Location: Left Arm, Patient Position: Sitting, Cuff Size: Normal)   Pulse 85   Temp 98.3 F (36.8 C) (Oral)   Ht 5' 5.25" (1.657 m)   Wt 160 lb 3.2 oz (72.7 kg)   LMP 09/25/2022 (Approximate)   SpO2 96%   BMI 26.45 kg/m  General appearance: alert, cooperative, and no distress Head: Normocephalic, without obvious abnormality, atraumatic Eyes: conjunctivae/corneas clear. PERRL, EOM's intact. Fundi benign. Ears: normal TM's and external ear canals both ears Nose: Nares normal. Septum midline. Mucosa normal. No drainage or sinus tenderness. Throat: lips, mucosa, and tongue normal; teeth and gums normal Neck: no adenopathy, no carotid bruit, no JVD, supple, symmetrical, trachea midline, and thyroid not enlarged, symmetric, no tenderness/mass/nodules Lungs: clear to auscultation bilaterally Heart: regular rate  and rhythm, S1, S2 normal, no murmur, click, rub or gallop Abdomen: soft, non-tender; bowel sounds normal; no masses,  no organomegaly Extremities: extremities normal, atraumatic, no cyanosis or edema Pulses: 2+ and symmetric Skin: Skin color, texture, turgor normal. No rashes or lesions Lymph nodes: Cervical, supraclavicular, and axillary nodes normal. Neurologic: Alert and oriented X 3, normal strength and tone. Normal symmetric reflexes. Normal coordination and gait    Assessment:    Healthy female exam.       Plan:    Anticipatory guidance given including wearing seatbelts, smoke detectors in the home, increasing physical activity, increasing p.o. intake of water and vegetables. -labs -mammogram done this wk and normal -colonoscopy referral placed for early screening 2/2 fam hx -pap done 09/02/20 -Immunizations reviewed.  Consider third dose of HPV vaccine. See After Visit Summary for Counseling Recommendations  Well adult exam - Plan: CMP  Vitamin D deficiency - Plan: Vitamin D, 25-hydroxy  Former smoker  -Continue smoking cessation encouraged -Continue Nicorette - Plan: CBC with Differential/Platelet  Family history of diabetes mellitus (DM) - Plan: Hemoglobin A1c  Family history of colon cancer requiring screening colonoscopy - Plan: Ambulatory referral to Gastroenterology  Pure hypertriglyceridemia -Triglycerides 197 on 10/14/2021 - Plan: Lipid panel, CMP  History of alcohol use -Continued cessation encouraged  Gastroesophageal reflux disease, unspecified whether esophagitis present  - Plan: omeprazole (PRILOSEC) 40 MG capsule  Follow-up as needed  Abbe Amsterdam, MD

## 2022-10-21 ENCOUNTER — Other Ambulatory Visit: Payer: Self-pay | Admitting: Family Medicine

## 2022-10-21 ENCOUNTER — Encounter: Payer: Self-pay | Admitting: Family Medicine

## 2022-10-21 ENCOUNTER — Encounter: Payer: Self-pay | Admitting: Internal Medicine

## 2022-10-21 DIAGNOSIS — E559 Vitamin D deficiency, unspecified: Secondary | ICD-10-CM

## 2022-10-21 MED ORDER — VITAMIN D (ERGOCALCIFEROL) 1.25 MG (50000 UNIT) PO CAPS: 50000.0000 [IU] | ORAL_CAPSULE | ORAL | 0 refills | Status: DC

## 2022-11-02 NOTE — Telephone Encounter (Signed)
See result note.  

## 2022-11-24 ENCOUNTER — Ambulatory Visit (INDEPENDENT_AMBULATORY_CARE_PROVIDER_SITE_OTHER): Payer: Managed Care, Other (non HMO) | Admitting: Internal Medicine

## 2022-11-24 ENCOUNTER — Encounter: Payer: Self-pay | Admitting: Internal Medicine

## 2022-11-24 ENCOUNTER — Other Ambulatory Visit: Payer: Managed Care, Other (non HMO)

## 2022-11-24 DIAGNOSIS — R194 Change in bowel habit: Secondary | ICD-10-CM

## 2022-11-24 DIAGNOSIS — R140 Abdominal distension (gaseous): Secondary | ICD-10-CM | POA: Diagnosis not present

## 2022-11-24 LAB — C-REACTIVE PROTEIN: CRP: <1 mg/dL (ref 0.5–20.0)

## 2022-11-24 NOTE — Progress Notes (Signed)
Chief Complaint: Change in bowel habits  HPI : 41 year old female with history of GERD, anxiety, and depression presents with change in bowel habits  She went to see her PCP for her annual wellness visit in 10/2022. For a few months, she would have constipation or diarrhea. With the use of Align probiotic, her stools have improved slightly. Except on the days that she drinks alcohol, her stools are solid. However, she feels like her stools are less substantial than prior and appear smaller in caliber. Denies blood in the stools or weight loss. On average she has 1-4 BMs per day. She usually drinks about 5-6 alcoholic beverages 3 days per week. Denies N&V, dysphagia, or ab pain. She does have GERD but this is well controlled on omeprazole. Her mother has IBS. Her maternal grandmother had colon cancer. Great aunt had colon cancer as well. 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 Denies prior colonoscopy.  Wt Readings from Last 3 Encounters:  11/24/22 162 lb (73.5 kg)  10/15/22 160 lb 3.2 oz (72.7 kg)  03/25/22 155 lb (70.3 kg)   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   Alcohol addiction (HCC)    AMENORRHEA    Anxiety    Depression    Eating disorder    GERD (gastroesophageal reflux disease)    HPV (human papilloma virus) anogenital infection Age 32   SINUSITIS- ACUTE-NOS    SORE THROAT    TOBACCO USER    Past Surgical History:  Procedure Laterality Date   COLPOSCOPY  Age 57   LEEP  2001   CIN I, CIN II   WISDOM TOOTH EXTRACTION  Age 64   Family History  Problem Relation Age of Onset   Depression Mother    Lung cancer Father 35   Cancer Father    Depression Father    Diabetes Father    Heart disease Father    Hyperlipidemia Father    Hypertension Father    Leukemia Sister 33       CML   Cancer Sister    Depression Sister    Mental illness Sister    Miscarriages / Stillbirths Sister    Colon cancer Maternal Grandmother     Arthritis Maternal Grandmother    Stomach cancer Maternal Grandfather    Diabetes Paternal Grandmother    Colon cancer Other        pat great aunt   Social History   Tobacco Use   Smoking status: Former    Packs/day: 0.50    Years: 15.00    Total pack years: 7.50    Types: Cigarettes    Quit date: 01/06/2021    Years since quitting: 1.8   Smokeless tobacco: Never   Tobacco comments:    Chews nictoret  Vaping Use   Vaping Use: Never used  Substance Use Topics   Alcohol use: Not Currently   Drug use: Yes    Types: Marijuana    Comment: occ   Current Outpatient Medications  Medication Sig Dispense Refill   Multiple Vitamins-Minerals (MULTIVITAMIN PO) Take by mouth.     norethindrone-ethinyl estradiol-FE (BLISOVI FE 1/20) 1-20 MG-MCG tablet Take 1 tablet by mouth daily. 84 tablet 2   Omega-3 Fatty Acids (FISH OIL) 1000 MG CAPS      omeprazole (PRILOSEC) 40 MG capsule TAKE 1 CAPSULE(40 MG) BY MOUTH DAILY 90 capsule 1   OVER THE COUNTER MEDICATION  daily. Align probiotic.     Vitamin D, Ergocalciferol, (DRISDOL) 1.25 MG (50000 UNIT) CAPS capsule Take 1 capsule (50,000 Units total) by mouth every 7 (seven) days. 12 capsule 0   No current facility-administered medications for this visit.   No Known Allergies   Review of Systems: All systems reviewed and negative except where noted in HPI.   Physical Exam: Pulse 83   Ht 5\' 5"  (1.651 m)   Wt 162 lb (73.5 kg)   BMI 26.96 kg/m  Constitutional: Pleasant,well-developed, female in no acute distress. HEENT: Normocephalic and atraumatic. Conjunctivae are normal. No scleral icterus. Cardiovascular: Normal rate, regular rhythm.  Pulmonary/chest: Effort normal and breath sounds normal. No wheezing, rales or rhonchi. Abdominal: Soft, nondistended, nontender. Bowel sounds active throughout. There are no masses palpable. No hepatomegaly. Extremities: No edema Neurological: Alert and oriented to person place and time. Skin: Skin is  warm and dry. No rashes noted. Psychiatric: Normal mood and affect. Behavior is normal.  Labs 10/2022: CBC and CMP unremarkable. Vit D low at 25.55.   Ab U/S complete 12/30/05: IMPRESSION:  Normal study. No evidence of pelvic mass or free fluid.   ASSESSMENT AND PLAN: Change in bowel habits Bloating Family history of colon cancer Patient presents with a change in bowel habits in terms of stool caliber over the last few months. Unclear what is driving this change. Will have her do a trial of the low FODMAP diet and will evaluate for underlying inflammation and celiac disease by performing labs. I also recommended a colonoscopy procedure to evaluate her change in bowel habits. Patient also does have some family  history of colon cancer in her grandmother and great aunt. Patient preferred to hold off on scheduling colonoscopy until 04/2022 after tax season. - Low FODMAP diet - Check CRP, TTG IgA, IgA - Recall for colonoscopy [facility] in 04/2022  05/2022, MD  I spent 45 minutes of time, including in depth chart review, independent review of results as outlined above, communicating results with the patient directly, face-to-face time with the patient, coordinating care, ordering studies and medications as appropriate, and documentation.

## 2022-11-24 NOTE — Patient Instructions (Addendum)
If you are age 40 or younger, your body mass index should be between 19-25. Your Body mass index is 26.96 kg/m. If this is out of the aformentioned range listed, please consider follow up with your Primary Care Provider.  ________________________________________________________  The [facility] providers would like to encourage you to use [facility] to communicate with providers for non-urgent requests or questions.  Due to long hold times on the telephone, sending your provider a message by [facility] may be a faster and more efficient way to get a response.  Please allow 48 business hours for a response.  Please remember that this is for non-urgent requests.  _______________________________________________________  Please go to the basement level for lab work before leaving today. Press "B" on the elevator. The lab is located at the first door on the left as you exit the elevator.  Due to recent changes in healthcare laws, you may see the results of your imaging and laboratory studies on MyChart before your provider has had a chance to review them.  We understand that in some cases there may be results that are confusing or concerning to you. Not all laboratory results come back in the same time frame and the provider may be waiting for multiple results in order to interpret others.  Please give [place] 48 hours in order for your provider to thoroughly review all the results before contacting the office for clarification of your results.   It has been recommended that you have a colonoscopy completed. Per your request, we did not schedule the procedure(s) today. Per your request the colonoscopy was not scheduled today, rather wait until May 2024.  We will contact you to schedule this procedure, and you will be scheduled for a pre-visit and procedure at that time.           Low-FODMAP Eating Plan  FODMAP stands for fermentable oligosaccharides, disaccharides, monosaccharides, and polyols. These are sugars that  are hard for some people to digest. A low-FODMAP eating plan may help some people who have irritable bowel syndrome (IBS) and certain other bowel (intestinal) diseases to manage their symptoms. This meal plan can be complicated to follow. Work with a diet and nutrition specialist (dietitian) to make a low-FODMAP eating plan that is right for you. A dietitian can help make sure that you get enough nutrition from this diet. What are tips for following this plan? Reading food labels Check labels for hidden FODMAPs such as: High-fructose syrup. Honey. Agave. Natural fruit flavors. Onion or garlic powder. Choose low-FODMAP foods that contain 3-4 grams of fiber per serving. Check food labels for serving sizes. Eat only one serving at a time to make sure FODMAP levels stay low. Shopping Shop with a list of foods that are recommended on this diet and make a meal plan. Meal planning Follow a low-FODMAP eating plan for up to 6 weeks, or as told by your health care provider or dietitian. To follow the eating plan: Eliminate high-FODMAP foods from your diet completely. Choose only low-FODMAP foods to eat. You will do this for 2-6 weeks. Gradually reintroduce high-FODMAP foods into your diet one at a time. Most people should wait a few days before introducing the next new high-FODMAP food into their meal plan. Your dietitian can recommend how quickly you may reintroduce foods. Keep a daily record of what and how much you eat and drink. Make note of any symptoms that you have after eating. Review your daily record with a dietitian regularly to identify  which foods you can eat and which foods you should avoid. General tips Drink enough fluid each day to keep your urine pale yellow. Avoid processed foods. These often have added sugar and may be high in FODMAPs. Avoid most dairy products, whole grains, and sweeteners. Work with a dietitian to make sure you get enough fiber in your diet. Avoid high FODMAP  foods at meals to manage symptoms. Recommended foods Fruits Bananas, oranges, tangerines, lemons, limes, blueberries, raspberries, strawberries, grapes, cantaloupe, honeydew melon, kiwi, papaya, passion fruit, and pineapple. Limited amounts of dried cranberries, banana chips, and shredded coconut. Vegetables Eggplant, zucchini, cucumber, peppers, green beans, bean sprouts, lettuce, arugula, kale, Swiss chard, spinach, collard greens, bok choy, summer squash, potato, and tomato. Limited amounts of corn, carrot, and sweet potato. Green parts of scallions. Grains Gluten-free grains, such as rice, oats, buckwheat, quinoa, corn, polenta, and millet. Gluten-free pasta, bread, or cereal. Rice noodles. Corn tortillas. Meats and other proteins Unseasoned beef, pork, poultry, or fish. Eggs. Tomasa Blase. Tofu (firm) and tempeh. Limited amounts of nuts and seeds, such as almonds, walnuts, [place] nuts, pecans, peanuts, nut butters, pumpkin seeds, chia seeds, and sunflower seeds. Dairy Lactose-free milk, yogurt, and kefir. Lactose-free cottage cheese and ice cream. Non-dairy milks, such as almond, coconut, hemp, and rice milk. Non-dairy yogurt. Limited amounts of goat cheese, brie, mozzarella, parmesan, swiss, and other hard cheeses. Fats and oils Butter-free spreads. Vegetable oils, such as olive, canola, and sunflower oil. Seasoning and other foods Artificial sweeteners with names that do not end in "ol," such as aspartame, saccharine, and stevia. Maple syrup, white table sugar, raw sugar, brown sugar, and molasses. Mayonnaise, soy sauce, and tamari. Fresh basil, coriander, parsley, rosemary, and thyme. Beverages Water and mineral water. Sugar-sweetened soft drinks. Small amounts of orange juice or cranberry juice. Black and green tea. Most dry wines. Coffee. The items listed above may not be a complete list of foods and beverages you can eat. Contact a dietitian for more information. Foods to avoid Fruits Fresh,  dried, and juiced forms of apple, pear, watermelon, peach, plum, cherries, apricots, blackberries, boysenberries, figs, nectarines, and mango. Avocado. Vegetables Chicory root, artichoke, asparagus, cabbage, snow peas, Brussels sprouts, broccoli, sugar snap peas, mushrooms, celery, and cauliflower. Onions, garlic, leeks, and the white part of scallions. Grains Wheat, including kamut, durum, and semolina. Barley and bulgur. Couscous. Wheat-based cereals. Wheat noodles, bread, crackers, and pastries. Meats and other proteins Fried or fatty meat. Sausage. Cashews and pistachios. Soybeans, baked beans, black beans, chickpeas, kidney beans, fava beans, navy beans, lentils, black-eyed peas, and split peas. Dairy Milk, yogurt, ice cream, and soft cheese. Cream and sour cream. Milk-based sauces. Custard. Buttermilk. Soy milk. Seasoning and other foods Any sugar-free gum or candy. Foods that contain artificial sweeteners such as sorbitol, mannitol, isomalt, or xylitol. Foods that contain honey, high-fructose corn syrup, or agave. Bouillon, vegetable stock, beef stock, and chicken stock. Garlic and onion powder. Condiments made with onion, such as hummus, chutney, pickles, relish, salad dressing, and salsa. Tomato paste. Beverages Chicory-based drinks. Coffee substitutes. Chamomile tea. Fennel tea. Sweet or fortified wines such as port or sherry. Diet soft drinks made with isomalt, mannitol, maltitol, sorbitol, or xylitol. Apple, pear, and mango juice. Juices with high-fructose corn syrup. The items listed above may not be a complete list of foods and beverages you should avoid. Contact a dietitian for more information. Summary FODMAP stands for fermentable oligosaccharides, disaccharides, monosaccharides, and polyols. These are sugars that are hard for some people to digest. A  low-FODMAP eating plan is a short-term diet that helps to ease symptoms of certain bowel diseases. The eating plan usually lasts up  to 6 weeks. After that, high-FODMAP foods are reintroduced gradually and one at a time. This can help you find out which foods may be causing symptoms. A low-FODMAP eating plan can be complicated. It is best to work with a dietitian who has experience with this type of plan. This information is not intended to replace advice given to you by your health care provider. Make sure you discuss any questions you have with your health care provider. Document Revised: 04/10/2020 Document Reviewed: 04/10/2020 Elsevier Patient Education  2023 [employer].  Thank you for entrusting me with your care and choosing [facility].  Dr Leonides Schanz

## 2022-11-25 LAB — TISSUE TRANSGLUTAMINASE, IGA: (tTG) Ab, IgA: <1 U/mL

## 2022-11-25 LAB — IGA: Immunoglobulin A: 193 mg/dL (ref 47–310)

## 2022-12-09 ENCOUNTER — Other Ambulatory Visit: Payer: Self-pay | Admitting: Obstetrics and Gynecology

## 2022-12-09 DIAGNOSIS — Z3041 Encounter for surveillance of contraceptive pills: Secondary | ICD-10-CM

## 2022-12-09 NOTE — Progress Notes (Signed)
42 y.o. G12P0020 Single White or Caucasian Not Hispanic or Latino female here for annual exam.  She is on OCP's. Cycles are light and short. No longer having pain with orgasms. No dyspareunia. No bowel or bladder c/o. She had a bought of IBS in the fall, symptoms improved with probiotics. She is following with GI, they are going to schedule her for a colonoscopy later this year (MGM with colon cancer, died at 15).  Period Cycle (Days): 28 Period Duration (Days): 4 Period Pattern: Regular Menstrual Flow: Moderate Menstrual Control: Maxi pad, Tampon Dysmenorrhea: (!) Mild Dysmenorrhea Symptoms: Cramping  She has had a couple of slightly elevated BP's at MD visits in the last year. She has been monitoring her BP at home for the last few weeks. BP has been ~130/94.   Her dog died in 2023/05/22.  She bought her first house last summer, her boyfriend moved in.   Patient's last menstrual period was 12/18/2022.          Sexually active: Yes.    The current method of family planning is BLISOVI-FE    Exercising: Yes.    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 Her smoking use included cigarettes. She has a 7.50 pack-year smoking history. She has never used smokeless tobacco. She reports current alcohol use. She reports current drug use. Drug: Marijuana. She is drinking 6-7 drinks a week (in one night). She is a Engineer, maintenance (IT).   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   Alcohol addiction ([facility])    AMENORRHEA    Anxiety    Depression    Eating disorder    GERD (gastroesophageal reflux disease)    HPV (human papilloma virus) anogenital infection Age 73   SINUSITIS- ACUTE-NOS    SORE THROAT     TOBACCO USER     Past Surgical History:  Procedure Laterality Date   COLPOSCOPY  Age 50   LEEP  2001   CIN I, CIN II   WISDOM TOOTH EXTRACTION  Age 60    Current Outpatient Medications  Medication Sig Dispense Refill   Multiple Vitamins-Minerals (MULTIVITAMIN PO) Take by mouth.     norethindrone-ethinyl estradiol-FE (BLISOVI FE 1/20) 1-20 MG-MCG tablet TAKE 1 TABLET BY MOUTH DAILY 84 tablet 1   Omega-3 Fatty Acids (FISH OIL) 1000 MG CAPS      omeprazole (PRILOSEC) 40 MG capsule TAKE 1 CAPSULE(40 MG) BY MOUTH DAILY 90 capsule 1   OVER THE COUNTER MEDICATION daily. Align probiotic.     Vitamin D, Ergocalciferol, (DRISDOL) 1.25 MG (50000 UNIT) CAPS capsule Take 1 capsule (50,000 Units total) by mouth every 7 (seven) days. 12 capsule 0   No current facility-administered medications for this visit.    Family History  Problem Relation Age of Onset   Depression Mother    Lung cancer Father 57   Cancer Father    Depression Father    Diabetes Father    Heart disease Father    Hyperlipidemia Father    Hypertension Father    Leukemia Sister 16       CML  Cancer Sister    Depression Sister    Mental illness Sister    Miscarriages / [place] Sister    Colon cancer Maternal Grandmother    Arthritis Maternal Grandmother    Stomach cancer Maternal Grandfather    Diabetes Paternal Grandmother    Colon cancer Other        pat great aunt    Review of Systems  All other systems reviewed and are negative.   Exam:   BP 134/80 (BP Location: Right Arm, Patient Position: Sitting, Cuff Size: Normal)   Resp 14   Ht 5\' 5"  (1.651 m)   Wt 163 lb (73.9 kg)   LMP 12/18/2022   BMI 27.12 kg/m   Weight change: @WEIGHTCHANGE @ Height:   Height: 5\' 5"  (165.1 cm)  Ht Readings from Last 3 Encounters:  12/21/22 5\' 5"  (1.651 m)  11/24/22 5\' 5"  (1.651 m)  10/15/22 5' 5.25" (1.657 m)    General appearance: alert, cooperative and appears stated age Head: Normocephalic, without obvious  abnormality, atraumatic Neck: no adenopathy, supple, symmetrical, trachea midline and thyroid normal to inspection and palpation Lungs: clear to auscultation bilaterally Cardiovascular: regular rate and rhythm Breasts: normal appearance, no masses or tenderness Abdomen: soft, non-tender; non distended,  no masses,  no organomegaly Extremities: extremities normal, atraumatic, no cyanosis or edema Skin: Skin color, texture, turgor normal. No rashes or lesions Lymph nodes: Cervical, supraclavicular, and axillary nodes normal. No abnormal inguinal nodes palpated Neurologic: Grossly normal   Pelvic: External genitalia:  no lesions              Urethra:  normal appearing urethra with no masses, tenderness or lesions              Bartholins and Skenes: normal                 Vagina: normal appearing vagina with normal color and discharge, no lesions              Cervix: no lesions               Bimanual Exam:  Uterus:  normal size, contour, position, consistency, mobility, non-tender              Adnexa: no mass, fullness, tenderness               Rectovaginal: Confirms               Anus:  normal sphincter tone, no lesions  Kimalexis, RMA chaperoned for the exam.  1. Well woman exam Discussed breast self exam Discussed calcium and vit D intake Mammogram utd Labs with primary  2. Encounter for surveillance of contraceptive pills Recently with some slightly elevated BP. Recommended she come off of OCP's  3. Need for HPV vaccination 3rd shot today - HPV 9-valent vaccine,Recombinat  4. Screening for cervical cancer - Cytology - PAP  5. General counseling and advice on female contraception Will try the mini-pill - norethindrone (ORTHO MICRONOR) 0.35 MG tablet; Take 1 tablet (0.35 mg total) by mouth daily.  Dispense: 28 tablet; Refill: 11

## 2022-12-09 NOTE — Telephone Encounter (Signed)
[facility] received for Blisovi 24 Fe.  Last AEX 03/25/22.  RF sent.

## 2022-12-09 NOTE — Telephone Encounter (Signed)
Pt left VM on triage line stating that her pharmacy told her that she was denied refills on her BCPs.  I spoke with her and advised her that we sent her some refills in this AM and also advised her to give the pharmacy a call and speak with someone versus relying on their electronic response system and advised her to contact us back if she has any trouble. Pt voiced understanding.

## 2022-12-21 ENCOUNTER — Ambulatory Visit: Payer: Managed Care, Other (non HMO) | Admitting: Obstetrics and Gynecology

## 2022-12-21 ENCOUNTER — Encounter: Payer: Self-pay | Admitting: Obstetrics and Gynecology

## 2022-12-21 ENCOUNTER — Other Ambulatory Visit (HOSPITAL_COMMUNITY)
Admission: RE | Admit: 2022-12-21 | Discharge: 2022-12-21 | Disposition: A | Discharge status: Home / Self Care | Payer: Managed Care, Other (non HMO) | Source: Ambulatory Visit | Attending: Obstetrics and Gynecology | Admitting: Obstetrics and Gynecology

## 2022-12-21 VITALS — BP 134/80 | Resp 14 | Ht 65.0 in | Wt 163.0 lb

## 2022-12-21 DIAGNOSIS — Z124 Encounter for screening for malignant neoplasm of cervix: Secondary | ICD-10-CM | POA: Diagnosis present

## 2022-12-21 DIAGNOSIS — Z01419 Encounter for gynecological examination (general) (routine) without abnormal findings: Secondary | ICD-10-CM

## 2022-12-21 DIAGNOSIS — Z23 Encounter for immunization: Secondary | ICD-10-CM | POA: Diagnosis not present

## 2022-12-21 DIAGNOSIS — Z3041 Encounter for surveillance of contraceptive pills: Secondary | ICD-10-CM

## 2022-12-21 DIAGNOSIS — Z3009 Encounter for other general counseling and advice on contraception: Secondary | ICD-10-CM

## 2022-12-21 MED ORDER — NORETHINDRONE 0.35 MG PO TABS: 1.0000 | ORAL_TABLET | Freq: Every day | ORAL | 11 refills | Status: DC

## 2022-12-21 NOTE — Patient Instructions (Signed)

## 2022-12-29 LAB — CYTOLOGY - PAP
Comment: NEGATIVE
Diagnosis: NEGATIVE
Diagnosis: REACTIVE
High risk HPV: NEGATIVE

## 2023-01-15 ENCOUNTER — Other Ambulatory Visit: Payer: Self-pay | Admitting: Family Medicine

## 2023-01-15 DIAGNOSIS — E559 Vitamin D deficiency, unspecified: Secondary | ICD-10-CM

## 2023-04-08 ENCOUNTER — Other Ambulatory Visit: Payer: Self-pay | Admitting: Family Medicine

## 2023-04-08 DIAGNOSIS — K219 Gastro-esophageal reflux disease without esophagitis: Secondary | ICD-10-CM

## 2023-04-22 ENCOUNTER — Encounter: Payer: Self-pay | Admitting: Obstetrics and Gynecology

## 2023-04-22 ENCOUNTER — Ambulatory Visit (INDEPENDENT_AMBULATORY_CARE_PROVIDER_SITE_OTHER): Payer: Managed Care, Other (non HMO) | Admitting: Obstetrics and Gynecology

## 2023-04-22 VITALS — BP 128/72 | HR 93

## 2023-04-22 DIAGNOSIS — N939 Abnormal uterine and vaginal bleeding, unspecified: Secondary | ICD-10-CM | POA: Diagnosis not present

## 2023-04-22 DIAGNOSIS — Z3009 Encounter for other general counseling and advice on contraception: Secondary | ICD-10-CM

## 2023-04-22 LAB — PREGNANCY, URINE: Preg Test, Ur: NEGATIVE

## 2023-04-22 MED ORDER — SLYND 4 MG PO TABS
1.0000 | ORAL_TABLET | Freq: Every day | ORAL | 3 refills | Status: DC
Start: 2023-04-22 — End: 2024-03-22

## 2023-04-22 NOTE — Progress Notes (Signed)
GYNECOLOGY  VISIT   HPI: 42 y.o.   Single White or Caucasian Not Hispanic or Latino  female   G2P0020 with Patient's last menstrual period was 04/06/2023 (exact date).   here for  irregular bleeding. Missed 1 pill a few weeks ago.  She was switched from OCP's to POP's in 1/24 after having some elevated BP's.  Initially her cycles were fine.  She is now getting 2 cycles within a 28 day pill pack. Bleed x 4-5 days, stops bleeding for 1-2 weeks and then bleeds again for 3 days. She is saturate a regular tampon in 3 hours.  She missed one pill ~ 5/3, having her second cycle at that time.  She has gained weight, is bloated, having mood swings. No constipation, dry skin, hair loss or fatigue.    BP at home has been ~ 122/90. She thinks her cuff may not be accurate.  She has been exercising more in the last month.   Same long term partner.   GYNECOLOGIC HISTORY: Patient's last menstrual period was 04/06/2023 (exact date). Contraception:POP Menopausal hormone therapy: N/A        OB History     Gravida  2   Para  0   Term      Preterm      AB  2   Living  0      SAB      IAB      Ectopic      Multiple      Live Births                 Patient Active Problem List   Diagnosis Date Noted   Low back pain 03/08/2022   Constipation 09/02/2021   Diarrhea 09/02/2021   Right lower quadrant pain 09/02/2021   Right upper quadrant pain 09/02/2021   History of depression 08/18/2018   Cigarette nicotine dependence without complication 08/18/2018   Asthmatic bronchitis , chronic (HCC) 01/06/2014   GERD (gastroesophageal reflux disease) 01/06/2014   TOBACCO USER 01/26/2010   SINUSITIS- ACUTE-NOS 02/13/2008   SORE THROAT 02/13/2008   AMENORRHEA 12/14/2007    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   Alcohol addiction ([facility])    AMENORRHEA    Anxiety    Depression    Eating disorder    GERD (gastroesophageal reflux disease)     HPV (human papilloma virus) anogenital infection Age 58   SINUSITIS- ACUTE-NOS    SORE THROAT    TOBACCO USER     Past Surgical History:  Procedure Laterality Date   COLPOSCOPY  Age 13   LEEP  2001   CIN I, CIN II   WISDOM TOOTH EXTRACTION  Age 68    Current Outpatient Medications  Medication Sig Dispense Refill   Multiple Vitamins-Minerals (MULTIVITAMIN PO) Take by mouth.     norethindrone (ORTHO MICRONOR) 0.35 MG tablet Take 1 tablet (0.35 mg total) by mouth daily. 28 tablet 11   Omega-3 Fatty Acids (FISH OIL) 1000 MG CAPS      omeprazole (PRILOSEC) 40 MG capsule TAKE 1 CAPSULE(40 MG) BY MOUTH DAILY 90 capsule 1   OVER THE COUNTER MEDICATION daily. Align probiotic.     Vitamin D, Ergocalciferol, (DRISDOL) 1.25 MG (50000 UNIT) CAPS capsule Take 1 capsule (50,000 Units total) by mouth every 7 (seven) days. 12 capsule 0   No current facility-administered medications for this visit.     ALLERGIES: Patient  has no known allergies.  Family History  Problem Relation Age of Onset   Depression Mother    Lung cancer Father 36   Cancer Father    Depression Father    Diabetes Father    Heart disease Father    Hyperlipidemia Father    Hypertension Father    Leukemia Sister 66       CML   Cancer Sister    Depression Sister    Mental illness Sister    Miscarriages / [place] Sister    Colon cancer Maternal Grandmother    Arthritis Maternal Grandmother    Stomach cancer Maternal Grandfather    Diabetes Paternal Grandmother    Colon cancer Other        pat great aunt    Social History   Socioeconomic History   Marital status: Single    Spouse name: Not on file   Number of children: 0   Years of education: Not on file   Highest education level: Not on file  Occupational History   Occupation: accountant  Tobacco Use   Smoking status: Former    Packs/day: 0.50    Years: 15.00    Additional pack years: 0.00    Total pack years: 7.50    Types: Cigarettes    Quit  date: 01/06/2021    Years since quitting: 2.2   Smokeless tobacco: Never   Tobacco comments:    Chews nictoret  Vaping Use   Vaping Use: Never used  Substance and Sexual Activity   Alcohol use: Yes   Drug use: Yes    Types: Marijuana    Comment: occ   Sexual activity: Yes    Partners: Male    Birth control/protection: Pill  Other Topics Concern   Not on file  Social History Narrative   Not on file   Social Determinants of Health   Financial Resource Strain: Not on file  Food Insecurity: Not on file  Transportation Needs: Not on file  Physical Activity: Not on file  Stress: Not on file  Social Connections: Not on file  Intimate Partner Violence: Not on file    Review of Systems  All other systems reviewed and are negative.   PHYSICAL EXAMINATION:    BP 128/72 (BP Location: Left Arm)   Pulse 93   LMP 04/06/2023 (Exact Date)   SpO2 98%     General appearance: alert, cooperative and appears stated age  30. Abnormal uterine bleeding Since switching from OCP's to micronor. Having some side effects as well.  - Pregnancy, urine: negative  2. General counseling and advice on female contraception We discussed taking a break from POP and using condoms. I wouldn't recommend depo-provera or nexplanon.  We discussed the option of the mirena IUD (information given) and drying a different POP. She would like to try Slynd.  - Drospirenone (SLYND) 4 MG TABS; Take 1 tablet (4 mg total) by mouth daily.  Dispense: 84 tablet; Refill: 3 -Reach out with continued issues

## 2023-06-14 ENCOUNTER — Encounter: Payer: Self-pay | Admitting: Family Medicine

## 2023-06-24 NOTE — Telephone Encounter (Signed)
Labs largely normal

## 2023-08-04 ENCOUNTER — Other Ambulatory Visit: Payer: Self-pay | Admitting: Family Medicine

## 2023-08-04 DIAGNOSIS — Z1231 Encounter for screening mammogram for malignant neoplasm of breast: Secondary | ICD-10-CM

## 2023-08-16 ENCOUNTER — Ambulatory Visit (INDEPENDENT_AMBULATORY_CARE_PROVIDER_SITE_OTHER): Payer: Managed Care, Other (non HMO) | Admitting: Nurse Practitioner

## 2023-08-16 ENCOUNTER — Encounter: Payer: Self-pay | Admitting: Nurse Practitioner

## 2023-08-16 VITALS — BP 120/74 | HR 62 | Wt 168.0 lb

## 2023-08-16 DIAGNOSIS — N764 Abscess of vulva: Secondary | ICD-10-CM | POA: Diagnosis not present

## 2023-08-16 NOTE — Progress Notes (Signed)
   Acute Office Visit  Subjective:    Patient ID: Heather Mclean, female    DOB: 03-Sep-1981, 42 y.o.   MRN: [id number]   HPI 42 y.o. presents today for vulvar bump x 10 days. Initially it felt sore, then became firm a day or so later. Pain is very minimal, really only notices it during her walks rubbing against her underwear. Unsure if it has changed in size, no drainage.   Patient's last menstrual period was 08/06/2023.    Review of Systems  Constitutional: Negative.   Genitourinary:  Positive for genital sores.       Objective:    Physical Exam Constitutional:      Appearance: Normal appearance.  Genitourinary:      BP 120/74   Pulse 62   Wt 168 lb (76.2 kg)   LMP 08/06/2023   SpO2 100%   BMI 27.96 kg/m  Wt Readings from Last 3 Encounters:  08/16/23 168 lb (76.2 kg)  12/21/22 163 lb (73.9 kg)  11/24/22 162 lb (73.5 kg)        Patient informed chaperone available to be present for breast and/or pelvic exam. Patient has requested no chaperone to be present. Patient has been advised what will be completed during breast and pelvic exam.   Assessment & Plan:   Problem List Items Addressed This Visit   None Visit Diagnoses     Vulvar boil    -  Primary      Plan: Reassurance on resolving vulvar boil. No redness or drainage present. Will monitor. Recommend warm compresses. Avoid shaving until healed.      Heather Mackie DNP, 2:30 PM 08/16/2023

## 2023-10-08 ENCOUNTER — Other Ambulatory Visit: Payer: Self-pay | Admitting: Family Medicine

## 2023-10-08 DIAGNOSIS — K219 Gastro-esophageal reflux disease without esophagitis: Secondary | ICD-10-CM

## 2023-10-13 ENCOUNTER — Encounter: Payer: Self-pay | Admitting: Internal Medicine

## 2023-10-17 ENCOUNTER — Ambulatory Visit
Admission: RE | Admit: 2023-10-17 | Discharge: 2023-10-17 | Disposition: A | Discharge status: Home / Self Care | Payer: Managed Care, Other (non HMO) | Source: Ambulatory Visit | Attending: Family Medicine | Admitting: Family Medicine

## 2023-10-17 ENCOUNTER — Encounter: Payer: Self-pay | Admitting: Family Medicine

## 2023-10-17 ENCOUNTER — Ambulatory Visit (INDEPENDENT_AMBULATORY_CARE_PROVIDER_SITE_OTHER): Payer: Managed Care, Other (non HMO) | Admitting: Family Medicine

## 2023-10-17 VITALS — BP 100/72 | HR 78 | Temp 98.7°F | Ht 65.0 in | Wt 172.6 lb

## 2023-10-17 DIAGNOSIS — Z87891 Personal history of nicotine dependence: Secondary | ICD-10-CM

## 2023-10-17 DIAGNOSIS — E782 Mixed hyperlipidemia: Secondary | ICD-10-CM | POA: Diagnosis not present

## 2023-10-17 DIAGNOSIS — E559 Vitamin D deficiency, unspecified: Secondary | ICD-10-CM

## 2023-10-17 DIAGNOSIS — Z0000 Encounter for general adult medical examination without abnormal findings: Secondary | ICD-10-CM

## 2023-10-17 DIAGNOSIS — Z1231 Encounter for screening mammogram for malignant neoplasm of breast: Secondary | ICD-10-CM

## 2023-10-17 LAB — CBC WITH DIFFERENTIAL/PLATELET
Basophils Absolute: 0 10*3/uL (ref 0.0–0.1)
Basophils Relative: 0.5 % (ref 0.0–3.0)
Eosinophils Absolute: 0.1 10*3/uL (ref 0.0–0.7)
Eosinophils Relative: 2.2 % (ref 0.0–5.0)
HCT: 42.3 % (ref 36.0–46.0)
Hemoglobin: 14.1 g/dL (ref 12.0–15.0)
Lymphocytes Relative: 34.2 % (ref 12.0–46.0)
Lymphs Abs: 1.7 10*3/uL (ref 0.7–4.0)
MCHC: 33.3 g/dL (ref 30.0–36.0)
MCV: 91.4 fL (ref 78.0–100.0)
Monocytes Absolute: 0.4 10*3/uL (ref 0.1–1.0)
Monocytes Relative: 8.6 % (ref 3.0–12.0)
Neutro Abs: 2.7 10*3/uL (ref 1.4–7.7)
Neutrophils Relative %: 54.5 % (ref 43.0–77.0)
Platelets: 259 10*3/uL (ref 150.0–400.0)
RBC: 4.63 Mil/uL (ref 3.87–5.11)
RDW: 13.3 % (ref 11.5–15.5)
WBC: 4.9 10*3/uL (ref 4.0–10.5)

## 2023-10-17 LAB — LIPID PANEL
Cholesterol: 170 mg/dL (ref 0–200)
HDL: 61.5 mg/dL (ref >39.00)
LDL Cholesterol: 76 mg/dL (ref 0–99)
NonHDL: 108.78
Total CHOL/HDL Ratio: 3
Triglycerides: 165 mg/dL — ABNORMAL HIGH (ref 0.0–149.0)
VLDL: 33 mg/dL (ref 0.0–40.0)

## 2023-10-17 LAB — COMPREHENSIVE METABOLIC PANEL
ALT: 15 U/L (ref 0–35)
AST: 22 U/L (ref 0–37)
Albumin: 4.4 g/dL (ref 3.5–5.2)
Alkaline Phosphatase: 47 U/L (ref 39–117)
BUN: 10 mg/dL (ref 6–23)
CO2: 26 meq/L (ref 19–32)
Calcium: 9.5 mg/dL (ref 8.4–10.5)
Chloride: 102 meq/L (ref 96–112)
Creatinine, Ser: 0.69 mg/dL (ref 0.40–1.20)
GFR: 108.06 mL/min (ref >60.00)
Glucose, Bld: 95 mg/dL (ref 70–99)
Potassium: 4 meq/L (ref 3.5–5.1)
Sodium: 136 meq/L (ref 135–145)
Total Bilirubin: 0.5 mg/dL (ref 0.2–1.2)
Total Protein: 7.2 g/dL (ref 6.0–8.3)

## 2023-10-17 LAB — HEMOGLOBIN A1C: Hgb A1c MFr Bld: 5.3 % (ref 4.6–6.5)

## 2023-10-17 LAB — VITAMIN D 25 HYDROXY (VIT D DEFICIENCY, FRACTURES): VITD: 31.49 ng/mL (ref 30.00–100.00)

## 2023-10-17 LAB — T4, FREE: Free T4: 0.82 ng/dL (ref 0.60–1.60)

## 2023-10-17 LAB — TSH: TSH: 1.76 u[IU]/mL (ref 0.35–5.50)

## 2023-10-17 NOTE — Progress Notes (Signed)
Established Patient Office Visit   Subjective  Patient ID: Heather Mclean, female    DOB: 1981/05/05  Age: 42 y.o. MRN: [id number]  Chief Complaint  Patient presents with   Annual Exam    Pt is a 42 yo female seen for CPE.  Patient states she is well.  Had recent labs due to applying for life insurance.  Pt quit smoking a little over 2 yrs ago by using Nicorette.  Still using the gum.  Plans to cut down on it.  Patient interested in rechecking vitamin D levels as previously low.  Has mammogram scheduled later today.  Pap scheduled in Jan 2025 w/ OB/Gyn.  Discussed colonoscopy as patient's grandmother had history of early colon cancer, age 70.     Patient Active Problem List   Diagnosis Date Noted   Low back pain 03/08/2022   Constipation 09/02/2021   Diarrhea 09/02/2021   Right lower quadrant pain 09/02/2021   Right upper quadrant pain 09/02/2021   History of depression 08/18/2018   Cigarette nicotine dependence without complication 08/18/2018   Asthmatic bronchitis , chronic (HCC) 01/06/2014   GERD (gastroesophageal reflux disease) 01/06/2014   TOBACCO USER 01/26/2010   SINUSITIS- ACUTE-NOS 02/13/2008   SORE THROAT 02/13/2008   AMENORRHEA 12/14/2007   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   Alcohol addiction ([facility])    AMENORRHEA    Anxiety    Depression    Eating disorder    GERD (gastroesophageal reflux disease)    HPV (human papilloma virus) anogenital infection Age 25   SINUSITIS- ACUTE-NOS    SORE THROAT    TOBACCO USER    Past Surgical History:  Procedure Laterality Date   COLPOSCOPY  Age 47   LEEP  2001   CIN I, CIN II   WISDOM TOOTH EXTRACTION  Age 60   Social History   Tobacco Use   Smoking status: Former    Current packs/day: 0.00    Average packs/day: 0.5 packs/day for 15.0 years (7.5 ttl pk-yrs)    Types: Cigarettes    Start date: 01/06/2006    Quit date: 01/06/2021    Years since quitting:  2.7   Smokeless tobacco: Never   Tobacco comments:    Chews nictoret  Vaping Use   Vaping status: Never Used  Substance Use Topics   Alcohol use: Yes   Drug use: Yes    Types: Marijuana    Comment: occ   Family History  Problem Relation Age of Onset   Depression Mother    Lung cancer Father 74   Cancer Father    Depression Father    Diabetes Father    Heart disease Father    Hyperlipidemia Father    Hypertension Father    Leukemia Sister 70       CML   Cancer Sister    Depression Sister    Mental illness Sister    Miscarriages / [place] Sister    Colon cancer Maternal Grandmother    Arthritis Maternal Grandmother    Stomach cancer Maternal Grandfather    Diabetes Paternal Grandmother    Colon cancer Other        pat great aunt   No Known Allergies    ROS Negative unless stated above    Objective:     BP 100/72 (BP Location: Left Arm, Patient Position: Sitting, Cuff Size: Normal)   Pulse 78   Temp 98.7  F (37.1 C) (Oral)   Ht 5\' 5"  (1.651 m)   Wt 172 lb 9.6 oz (78.3 kg)   LMP 09/30/2023   SpO2 98%   BMI 28.72 kg/m  BP Readings from Last 3 Encounters:  10/17/23 100/72  08/16/23 120/74  04/22/23 128/72   Wt Readings from Last 3 Encounters:  10/17/23 172 lb 9.6 oz (78.3 kg)  08/16/23 168 lb (76.2 kg)  12/21/22 163 lb (73.9 kg)      Physical Exam Constitutional:      Appearance: Normal appearance.  HENT:     Head: Normocephalic and atraumatic.     Right Ear: Tympanic membrane, ear canal and external ear normal.     Left Ear: Tympanic membrane, ear canal and external ear normal.     Nose: Nose normal.     Mouth/Throat:     Mouth: Mucous membranes are moist.     Pharynx: No oropharyngeal exudate or posterior oropharyngeal erythema.  Eyes:     General: No scleral icterus.    Extraocular Movements: Extraocular movements intact.     Conjunctiva/sclera: Conjunctivae normal.     Pupils: Pupils are equal, round, and reactive to light.  Neck:      Thyroid: No thyromegaly.  Cardiovascular:     Rate and Rhythm: Normal rate and regular rhythm.     Pulses: Normal pulses.     Heart sounds: Normal heart sounds. No murmur heard.    No friction rub.  Pulmonary:     Effort: Pulmonary effort is normal.     Breath sounds: Normal breath sounds. No wheezing, rhonchi or rales.  Abdominal:     General: Bowel sounds are normal.     Palpations: Abdomen is soft.     Tenderness: There is abdominal tenderness in the left upper quadrant and left lower quadrant.  Musculoskeletal:        General: No deformity. Normal range of motion.  Lymphadenopathy:     Cervical: No cervical adenopathy.  Skin:    General: Skin is warm and dry.     Findings: No lesion.  Neurological:     General: No focal deficit present.     Mental Status: She is alert and oriented to person, place, and time.  Psychiatric:        Mood and Affect: Mood normal.        Thought Content: Thought content normal.     No results found for any visits on 10/17/23.    Assessment & Plan:  Well adult exam -Age-appropriate health screenings discussed -Will obtain labs this visit.  Recent labs to be sent to clinic at patient's request from insurance screening. -Discussed immunizations.  Patient declines Influenza vaccine at this time. -Consider early colonoscopy 2/2 family history of early colon cancer in grandmother.  Patient to contact clinic when ready to schedule. -Mammogram to be done today. -Pap with OB/GYN on 12/29/2022. -Next CPE in 1 year -     CBC with Differential/Platelet; Future -     Comprehensive metabolic panel; Future -     Lipid panel; Future -     TSH; Future -     Hemoglobin A1c; Future -     T4, free; Future  Vitamin D deficiency -     VITAMIN D 25 Hydroxy (Vit-D Deficiency, Fractures); Future  Former smoker -Patient congratulated on being 2+ years cigarette free. -Continued smoking cessation encouraged. -Discussed weaning off Nicorette  gum -Continue to monitor each visit -     CBC with  Differential/Platelet; Future  Mixed hyperlipidemia -Lifestyle modifications encouraged -     Comprehensive metabolic panel; Future -     Lipid panel; Future    Return if symptoms worsen or fail to improve.   Deeann Saint, MD

## 2023-10-19 ENCOUNTER — Other Ambulatory Visit: Payer: Self-pay | Admitting: Family Medicine

## 2023-10-20 LAB — LAB REPORT - SCANNED
Creatinine, POC: 37 mg/dL
EGFR: 108.1
HM HIV Screening: NEGATIVE

## 2023-10-21 ENCOUNTER — Encounter: Payer: Self-pay | Admitting: Family Medicine

## 2023-10-25 NOTE — Telephone Encounter (Signed)
Pt had a CPE on 10/17/23. Pt called to say she would like to come in on Monday, 10/31/23 to do get some lab work done.  I do not see any recent active lab orders for Pt.  Will MD put in an order, or does Pt need to schedule an OV?  Please advise.

## 2023-10-26 ENCOUNTER — Encounter: Payer: Self-pay | Admitting: Family Medicine

## 2023-10-26 ENCOUNTER — Telehealth: Payer: Self-pay | Admitting: Family Medicine

## 2023-10-26 NOTE — Telephone Encounter (Signed)
Pt would like to transfer to dr Ardyth Harps pt was referred by Ginger Earlean Polka

## 2023-10-26 NOTE — Telephone Encounter (Signed)
Ok

## 2023-10-27 ENCOUNTER — Ambulatory Visit: Payer: Managed Care, Other (non HMO) | Admitting: Family Medicine

## 2023-10-27 NOTE — Telephone Encounter (Signed)
Lmom for pt to call back. 

## 2023-10-27 NOTE — Telephone Encounter (Signed)
Pt called to F/U.   MD agreed to see Pt, as per NR.  Pt scheduled for TOC on 11/10/23 with Dr. Ardyth Harps.

## 2023-10-27 NOTE — Telephone Encounter (Signed)
noted 

## 2023-10-28 NOTE — Telephone Encounter (Signed)
Pt has been sch for 11-10-2023

## 2023-10-31 ENCOUNTER — Other Ambulatory Visit: Payer: Managed Care, Other (non HMO)

## 2023-11-10 ENCOUNTER — Encounter: Payer: Self-pay | Admitting: Internal Medicine

## 2023-11-10 ENCOUNTER — Encounter: Payer: Managed Care, Other (non HMO) | Admitting: Internal Medicine

## 2023-11-10 ENCOUNTER — Ambulatory Visit (INDEPENDENT_AMBULATORY_CARE_PROVIDER_SITE_OTHER): Payer: Managed Care, Other (non HMO) | Admitting: Internal Medicine

## 2023-11-10 VITALS — BP 102/78 | HR 79 | Temp 98.6°F | Ht 65.0 in | Wt 176.6 lb

## 2023-11-10 DIAGNOSIS — F17200 Nicotine dependence, unspecified, uncomplicated: Secondary | ICD-10-CM

## 2023-11-10 DIAGNOSIS — R319 Hematuria, unspecified: Secondary | ICD-10-CM | POA: Diagnosis not present

## 2023-11-10 DIAGNOSIS — E559 Vitamin D deficiency, unspecified: Secondary | ICD-10-CM

## 2023-11-10 DIAGNOSIS — K219 Gastro-esophageal reflux disease without esophagitis: Secondary | ICD-10-CM | POA: Diagnosis not present

## 2023-11-10 LAB — URINALYSIS
Bilirubin Urine: NEGATIVE
Ketones, ur: NEGATIVE
Leukocytes,Ua: NEGATIVE
Nitrite: NEGATIVE
Specific Gravity, Urine: <=1.005 — AB (ref 1.000–1.030)
Total Protein, Urine: NEGATIVE
Urine Glucose: NEGATIVE
Urobilinogen, UA: 0.2 (ref 0.0–1.0)
pH: 6 (ref 5.0–8.0)

## 2023-11-10 LAB — POC URINALSYSI DIPSTICK (AUTOMATED)
Bilirubin, UA: NEGATIVE
Glucose, UA: NEGATIVE
Ketones, UA: NEGATIVE
Leukocytes, UA: NEGATIVE
Nitrite, UA: NEGATIVE
Protein, UA: NEGATIVE
Spec Grav, UA: 1.015 (ref 1.010–1.025)
Urobilinogen, UA: 0.2 U/dL
pH, UA: 6 (ref 5.0–8.0)

## 2023-11-10 NOTE — Progress Notes (Signed)
Established Patient Office Visit     CC/Reason for Visit: Establish care, discuss chronic and acute concerns  HPI: Heather Mclean is a 42 y.o. female who is coming in today for the above mentioned reasons. Past Medical History is significant for: GERD on daily PPI therapy and vitamin D deficiency.  She was a smoker but quit 3 years ago.  She has 5 alcoholic drinks 3 days a week, no drug allergies.  Her family history significant for a sister with CML a maternal grandfather with gastric cancer and a paternal grandmother with A-fib.  There are several family members with diabetes.  Her maternal grandmother died from colon cancer at age 76.  She has been dealing with some bloating and flatulence and is pending a colonoscopy with Dr. Leonides Schanz.  She is due for seasonal vaccines.  She had some lab work for [employer] and was told she had hematuria.  Reviewing that result her urine dipstick was positive for blood but she had no RBCs in her urine.  She has no lower abdominal pain or UTI symptoms.   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 Her smoking use included cigarettes. She started smoking about 17 years ago. She has a 7.5 pack-year smoking history. She has never used smokeless tobacco. She reports current alcohol use. She reports current drug use. Drug: Marijuana.  Allergies: No Known Allergies  Family History:  Family History  Problem Relation Age  of Onset   Depression Mother    Lung cancer Father 56   Cancer Father    Depression Father    Diabetes Father    Heart disease Father    Hyperlipidemia Father    Hypertension Father    Leukemia Sister 48       CML   Cancer Sister    Depression Sister    Mental illness Sister    Miscarriages / Stillbirths Sister    Colon cancer Maternal Grandmother    Arthritis Maternal Grandmother    Stomach cancer Maternal Grandfather    Diabetes Paternal Grandmother    Colon cancer Other        pat great aunt     Current Outpatient Medications:    Drospirenone (SLYND) 4 MG TABS, Take 1 tablet (4 mg total) by mouth daily., Disp: 84 tablet, Rfl: 3   Multiple Vitamins-Minerals (MULTIVITAMIN PO), Take by mouth., Disp: , Rfl:    omeprazole (PRILOSEC) 40 MG capsule, TAKE 1 CAPSULE(40 MG) BY MOUTH DAILY, Disp: 90 capsule, Rfl: 1   OVER THE COUNTER MEDICATION, daily. Align probiotic., Disp: , Rfl:    Vitamin D, Ergocalciferol, (DRISDOL) 1.25 MG (50000 UNIT) CAPS capsule, Take 1 capsule (50,000 Units total) by mouth every 7 (seven) days., Disp: 12 capsule, Rfl: 0  Review of Systems:  Negative unless  indicated in HPI.   Physical Exam: Vitals:   11/10/23 1102  BP: 102/78  Pulse: 79  Temp: 98.6 F (37 C)  TempSrc: Oral  SpO2: 98%  Weight: 176 lb 9.6 oz (80.1 kg)  Height: 5\' 5"  (1.651 m)    Body mass index is 29.39 kg/m.   Physical Exam Vitals reviewed.  Constitutional:      Appearance: Normal appearance.  HENT:     Head: Normocephalic and atraumatic.  Eyes:     Conjunctiva/sclera: Conjunctivae normal.     Pupils: Pupils are equal, round, and reactive to light.  Cardiovascular:     Rate and Rhythm: Normal rate and regular rhythm.  Pulmonary:     Effort: Pulmonary effort is normal.     Breath sounds: Normal breath sounds.  Skin:    General: Skin is warm and dry.  Neurological:     General: No focal deficit present.     Mental Status: She is alert and oriented to person,  place, and time.  Psychiatric:        Mood and Affect: Mood normal.        Behavior: Behavior normal.        Thought Content: Thought content normal.        Judgment: Judgment normal.      Impression and Plan:  Hematuria, unspecified type -     POCT Urinalysis Dipstick (Automated) -     Urinalysis; Future -     Urine Culture; Future  TOBACCO USER  Gastroesophageal reflux disease, unspecified whether esophagitis present  Vitamin D deficiency   -Continue daily PPI therapy for GERD. -Congratulated on tobacco cessation. -Urine dipstick in office is again positive for blood, sent for UA.  If no RBCs in urine, do not feel this needs to be pursued further.  Time spent:33 minutes reviewing chart, interviewing and examining patient and formulating plan of care.     Chaya Jan, MD [facility]

## 2023-11-11 LAB — URINE CULTURE
MICRO NUMBER:: 15813696
Result:: NO GROWTH
SPECIMEN QUALITY:: ADEQUATE

## 2023-12-26 ENCOUNTER — Other Ambulatory Visit (HOSPITAL_COMMUNITY)
Admission: RE | Admit: 2023-12-26 | Discharge: 2023-12-26 | Disposition: A | Discharge status: Home / Self Care | Payer: Managed Care, Other (non HMO) | Source: Ambulatory Visit | Attending: Obstetrics and Gynecology | Admitting: Obstetrics and Gynecology

## 2023-12-26 ENCOUNTER — Encounter: Payer: Self-pay | Admitting: Obstetrics and Gynecology

## 2023-12-26 ENCOUNTER — Ambulatory Visit (INDEPENDENT_AMBULATORY_CARE_PROVIDER_SITE_OTHER): Payer: Managed Care, Other (non HMO) | Admitting: Obstetrics and Gynecology

## 2023-12-26 VITALS — BP 132/92 | HR 88 | Ht 65.25 in | Wt 177.0 lb

## 2023-12-26 DIAGNOSIS — Z01419 Encounter for gynecological examination (general) (routine) without abnormal findings: Secondary | ICD-10-CM | POA: Insufficient documentation

## 2023-12-26 DIAGNOSIS — Z1331 Encounter for screening for depression: Secondary | ICD-10-CM

## 2023-12-26 DIAGNOSIS — N951 Menopausal and female climacteric states: Secondary | ICD-10-CM | POA: Diagnosis not present

## 2023-12-26 NOTE — Progress Notes (Signed)
43 y.o. y.o. female here for annual exam. No LMP recorded. Patient is perimenopausal.   Has some irregular periods, hot flashes, weight gain Patient's last menstrual period was 12/18/2022.          Sexually active: Yes.    The current method of family planning is SLYND Exercising: Yes.    walking Smoker: No  Health Maintenance: Pap: 08/29/20 ASCUS Hr HPV Neg, 12/15/16 Neg:Neg HR HPV          History of abnormal Pap:  Yes, hx of CINII in the early 2000's MMG:  10/19/23 Bi-rads 1 neg  BMD:   n/a Colonoscopy: to do this year, family history of colon cancer TDaP:  12/06/2013 Gardasil: 03/25/22; 11/26/21 Body mass index is 29.23 kg/m.     12/26/2023    3:11 PM 11/10/2023    1:22 PM 10/17/2023   11:19 AM  Depression screen PHQ 2/9  Decreased Interest 0 0 0  Down, Depressed, Hopeless 0 0 0  PHQ - 2 Score 0 0 0  Altered sleeping  0 0  Tired, decreased energy  0 0  Change in appetite  0 0  Feeling bad or failure about yourself   0 0  Trouble concentrating  0 0  Moving slowly or fidgety/restless  0 0  Suicidal thoughts  0 0  PHQ-9 Score  0 0  Difficult doing work/chores  Not difficult at all Not difficult at all    Height 5' 5.25" (1.657 m), weight 177 lb (80.3 kg).     Component Value Date/Time   DIAGPAP  12/21/2022 1350    - Negative for Intraepithelial Lesions or Malignancy (NILM)   DIAGPAP - Benign reactive/reparative changes 12/21/2022 1350   DIAGPAP (A) 08/29/2020 0841    - Atypical squamous cells of undetermined significance (ASC-US)   HPVHIGH Negative 12/21/2022 1350   HPVHIGH Negative 08/29/2020 0841   ADEQPAP  12/21/2022 1350    Satisfactory for evaluation; transformation zone component PRESENT.   ADEQPAP  08/29/2020 0841    Satisfactory for evaluation; transformation zone component PRESENT.    GYN HISTORY:    Component Value Date/Time   DIAGPAP  12/21/2022 1350    - Negative for Intraepithelial Lesions or Malignancy (NILM)   DIAGPAP - Benign  reactive/reparative changes 12/21/2022 1350   DIAGPAP (A) 08/29/2020 0841    - Atypical squamous cells of undetermined significance (ASC-US)   HPVHIGH Negative 12/21/2022 1350   HPVHIGH Negative 08/29/2020 0841   ADEQPAP  12/21/2022 1350    Satisfactory for evaluation; transformation zone component PRESENT.   ADEQPAP  08/29/2020 0841    Satisfactory for evaluation; transformation zone component PRESENT.    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 84 tablet 3   Ginger, Zingiber officinalis, (GINGER ROOT PO) Take by mouth.     Multiple Vitamins-Minerals (MULTIVITAMIN PO) Take by mouth.     omeprazole (PRILOSEC) 40 MG capsule TAKE 1 CAPSULE(40 MG) BY MOUTH DAILY 90 capsule 1   OVER THE COUNTER MEDICATION daily. Align probiotic.     Turmeric (QC TUMERIC COMPLEX PO) Take by mouth.     VITAMIN D PO Take by mouth.     No current facility-administered medications on file prior to visit.    Social History   Socioeconomic History   Marital status: Single    Spouse name: Not on file   Number of children: 0   Years of education: Not on file   Highest education level: Not on file  Occupational  History   Occupation: accountant  Tobacco Use   Smoking status: Former    Current packs/day: 0.00    Average packs/day: 0.5 packs/day for 15.0 years (7.5 ttl pk-yrs)    Types: Cigarettes    Start date: 01/06/2006    Quit date: 01/06/2021    Years since quitting: 2.9   Smokeless tobacco: Never   Tobacco comments:    Chews nictoret  Vaping Use   Vaping status: Never Used  Substance and Sexual Activity   Alcohol use: Yes    Comment: occasionally   Drug use: Yes    Types: Marijuana    Comment: occ   Sexual activity: Yes    Partners: Male    Birth control/protection: Pill    Comment: Menarche @ 16, First IC @ 16, Hx of HPV  Other Topics Concern   Not on file  Social History Narrative   Not on file   Social Drivers of Health   Financial Resource Strain: Not on file  Food Insecurity: Not on file  Transportation Needs: Not on file  Physical Activity: Not on file  Stress: Not on file  Social Connections: Not on file  Intimate Partner Violence: Not on file    Family History  Problem Relation Age of Onset   Depression Mother    Lung cancer Father 19   Cancer Father    Depression Father    Diabetes Father    Heart disease Father    Hyperlipidemia Father    Hypertension Father    Leukemia Sister 3       CML   Cancer Sister    Depression Sister    Mental illness Sister    Miscarriages / [place] Sister    Colon cancer Maternal Grandmother    Arthritis Maternal Grandmother    Stomach cancer Maternal Grandfather    Diabetes Paternal Grandmother    Colon cancer Other        pat great aunt     No Known Allergies    Patient's last menstrual period was No LMP recorded. Patient is perimenopausal..           Review of Systems Alls systems reviewed and are negative.     Physical Exam Constitutional:      Appearance: Normal appearance.  Genitourinary:     Vulva and urethral meatus  normal.     No lesions in the vagina.     Right Labia: No rash, lesions or skin  changes.    Left Labia: No lesions, skin changes or rash.    No vaginal discharge or tenderness.     No vaginal prolapse present.    No vaginal atrophy present.     Right Adnexa: not tender, not palpable and no mass present.    Left Adnexa: not tender, not palpable and no mass present.    No cervical motion tenderness or discharge.     Uterus is not enlarged, tender or irregular.  Breasts:    Right: Normal.     Left: Normal.  HENT:     Head: Normocephalic.  Neck:     Thyroid: No thyroid mass, thyromegaly or thyroid tenderness.  Cardiovascular:     Rate and Rhythm: Normal rate and regular rhythm.     Heart sounds: Normal heart sounds, S1 normal and S2 normal.  Pulmonary:     Effort: Pulmonary effort is normal.     Breath sounds: Normal breath sounds and air entry.  Abdominal:     General: There is no distension.     Palpations: Abdomen is soft. There is no mass.     Tenderness: There is no abdominal tenderness. There is no guarding or rebound.  Musculoskeletal:        General: Normal range of motion.     Cervical back: Full passive range of motion without pain, normal range of motion and neck supple. No tenderness.     Right lower leg: No edema.     Left lower leg: No edema.  Neurological:     Mental Status: She is alert.  Skin:    General: Skin is warm.  Psychiatric:        Mood and Affect: Mood normal.        Behavior: Behavior normal.        Thought Content: Thought content normal.  Vitals and nursing note reviewed. Exam conducted with a chaperone present.       A:         Well Woman GYN exam                             P:        Pap smear collected today Encouraged annual mammogram screening Colon cancer screening has referral DXA not indicated Labs - see orders. HRT panel and vit d. May benefit from patch. Had increased bp on ocp's with estrogen Discussed breast self exams Encouraged healthy lifestyle practices Encouraged Vit D and Calcium   No follow-ups  on file.  Earley Favor

## 2023-12-27 LAB — FOLLICLE STIMULATING HORMONE: FSH: 6 m[IU]/mL

## 2023-12-27 LAB — VITAMIN D 25 HYDROXY (VIT D DEFICIENCY, FRACTURES): Vit D, 25-Hydroxy: 30 ng/mL (ref 30–100)

## 2023-12-27 LAB — ESTRADIOL: Estradiol: 103 pg/mL

## 2023-12-28 ENCOUNTER — Encounter: Payer: Self-pay | Admitting: Obstetrics and Gynecology

## 2024-01-02 LAB — CYTOLOGY - PAP
Comment: NEGATIVE
Diagnosis: NEGATIVE
High risk HPV: NEGATIVE

## 2024-02-09 ENCOUNTER — Encounter: Payer: Self-pay | Admitting: Internal Medicine

## 2024-02-09 ENCOUNTER — Encounter: Payer: Self-pay | Admitting: Obstetrics and Gynecology

## 2024-02-09 DIAGNOSIS — N644 Mastodynia: Secondary | ICD-10-CM

## 2024-02-09 DIAGNOSIS — R9230 Dense breasts, unspecified: Secondary | ICD-10-CM

## 2024-02-09 NOTE — Telephone Encounter (Signed)
 Dx MMG ordered 12/26/23 Normal breast exam documented 12/26/23  MyChart message to patient.

## 2024-03-06 ENCOUNTER — Ambulatory Visit (AMBULATORY_SURGERY_CENTER)

## 2024-03-06 VITALS — Ht 65.0 in | Wt 165.0 lb

## 2024-03-06 DIAGNOSIS — R140 Abdominal distension (gaseous): Secondary | ICD-10-CM

## 2024-03-06 DIAGNOSIS — R194 Change in bowel habit: Secondary | ICD-10-CM

## 2024-03-06 MED ORDER — NA SULFATE-K SULFATE-MG SULF 17.5-3.13-1.6 GM/177ML PO SOLN: 1.0000 | Freq: Once | ORAL | 0 refills | Status: AC

## 2024-03-06 NOTE — Progress Notes (Signed)
 No egg or soy allergy known to patient  No issues known to pt with past sedation with any surgeries or procedures Patient denies ever being told they had issues or difficulty with intubation  No FH of Malignant Hyperthermia Pt is not on diet pills nor GLP-1 medications Pt is not on home 02  Pt is not on blood thinners  Pt denies issues with chronic constipation  No A fib or A flutter Have any cardiac testing pending--no Ambulates independently

## 2024-03-13 ENCOUNTER — Other Ambulatory Visit: Payer: Self-pay | Admitting: Family Medicine

## 2024-03-13 DIAGNOSIS — K219 Gastro-esophageal reflux disease without esophagitis: Secondary | ICD-10-CM

## 2024-03-21 ENCOUNTER — Encounter: Payer: Self-pay | Admitting: Obstetrics and Gynecology

## 2024-03-21 ENCOUNTER — Encounter: Payer: Self-pay | Admitting: Internal Medicine

## 2024-03-21 DIAGNOSIS — Z3009 Encounter for other general counseling and advice on contraception: Secondary | ICD-10-CM

## 2024-03-22 ENCOUNTER — Other Ambulatory Visit: Payer: Self-pay

## 2024-03-22 DIAGNOSIS — Z01419 Encounter for gynecological examination (general) (routine) without abnormal findings: Secondary | ICD-10-CM

## 2024-03-22 MED ORDER — SLYND 4 MG PO TABS: 1.0000 | ORAL_TABLET | Freq: Every day | ORAL | 3 refills | Status: DC

## 2024-03-22 NOTE — Telephone Encounter (Signed)
 Medication refill request: slynd Last AEX:  12-26-23 Dr. Tia Flowers pt Next AEX: not scheduled Last MMG (if hormonal medication request): 11/24 category c densitry birads 1:neg Refill authorized: please approve if appropriate

## 2024-03-26 ENCOUNTER — Encounter: Payer: Self-pay | Admitting: Internal Medicine

## 2024-03-26 ENCOUNTER — Ambulatory Visit (AMBULATORY_SURGERY_CENTER): Admitting: Internal Medicine

## 2024-03-26 VITALS — BP 123/74 | HR 83 | Temp 97.9°F | Resp 16 | Ht 65.0 in | Wt 177.4 lb

## 2024-03-26 DIAGNOSIS — R140 Abdominal distension (gaseous): Secondary | ICD-10-CM

## 2024-03-26 DIAGNOSIS — K648 Other hemorrhoids: Secondary | ICD-10-CM | POA: Diagnosis present

## 2024-03-26 DIAGNOSIS — R194 Change in bowel habit: Secondary | ICD-10-CM | POA: Diagnosis not present

## 2024-03-26 MED ORDER — SODIUM CHLORIDE 0.9 % IV SOLN: 500.0000 mL | Freq: Once | INTRAVENOUS | Status: DC

## 2024-03-26 NOTE — Patient Instructions (Addendum)
 Discharge patient to home (with escort).                           - Await pathology results.                           - Consideer starting a daily fiber supplement.                           - The findings and recommendations were discussed                            with the patient.     YOU HAD AN ENDOSCOPIC PROCEDURE TODAY AT THE [facility]:   Refer to the procedure report that was given to you for any specific questions about what was found during the examination.  If the procedure report does not answer your questions, please call your gastroenterologist to clarify.  If you requested that your care partner not be given the details of your procedure findings, then the procedure report has been included in a sealed envelope for you to review at your convenience later.  YOU SHOULD EXPECT: Some feelings of bloating in the abdomen. Passage of more gas than usual.  Walking can help get rid of the air that was put into your GI tract during the procedure and reduce the bloating. If you had a lower endoscopy (such as a colonoscopy or flexible sigmoidoscopy) you may notice spotting of blood in your stool or on the toilet paper. If you underwent a bowel prep for your procedure, you may not have a normal bowel movement for a few days.  Please Note:  You might notice some irritation and congestion in your nose or some drainage.  This is from the oxygen used during your procedure.  There is no need for concern and it should clear up in a day or so.  SYMPTOMS TO REPORT IMMEDIATELY:  Following lower endoscopy (colonoscopy or flexible sigmoidoscopy):  Excessive amounts of blood in the stool  Significant tenderness or worsening of abdominal pains  Swelling of the abdomen that is new, acute  Fever of 100F or higher   For urgent or emergent issues, a gastroenterologist can be reached at any hour by calling [phone]. Do not use MyChart messaging for urgent concerns.    DIET:  We do  recommend a small meal at first, but then you may proceed to your regular diet.  Drink plenty of fluids but you should avoid alcoholic beverages for 24 hours.  ACTIVITY:  You should plan to take it easy for the rest of today and you should NOT DRIVE or use heavy machinery until tomorrow (because of the sedation medicines used during the test).    FOLLOW UP: Our staff will call the number listed on your records the next business day following your procedure.  We will call around 7:15- 8:00 am to check on you and address any questions or concerns that you may have regarding the information given to you following your procedure. If we do not reach you, we will leave a message.     If any biopsies were taken you will be contacted by phone or by letter within the next 1-3 weeks.  Please call us  at [phone] if you have not heard about the biopsies in  3 weeks.    SIGNATURES/CONFIDENTIALITY: You and/or your care partner have signed paperwork which will be entered into your electronic medical record.  These signatures attest to the fact that that the information above on your After Visit Summary has been reviewed and is understood.  Full responsibility of the confidentiality of this discharge information lies with you and/or your care-partner.

## 2024-03-26 NOTE — Progress Notes (Signed)
 GASTROENTEROLOGY PROCEDURE H&P NOTE   Primary Care Physician: Zilphia Hilt, Charyl Coppersmith, MD    Reason for Procedure:   Change in bowel habits  Plan:    Colonoscopy  Patient is appropriate for endoscopic procedure(s) in the ambulatory ([facility]) setting.  The nature of the procedure, as well as the risks, benefits, and alternatives were carefully and thoroughly reviewed with the patient. Ample time for discussion and questions allowed. The patient understood, was satisfied, and agreed to proceed.     HPI: Heather Mclean is a 43 y.o. female who presents for colonoscopy for evaluation of change in bowel habits .  Patient was most recently seen in the Gastroenterology Clinic on 11/24/22.  No interval change in medical history since that appointment. She does have diarrhea on occasion.  Please refer to that note for full details regarding GI history and clinical presentation.   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   Alcohol  addiction (HCC)    AMENORRHEA    Anxiety    Depression    Eating disorder    GERD (gastroesophageal reflux disease)    HPV (human papilloma virus) anogenital infection Age 38   SINUSITIS- ACUTE-NOS    SORE THROAT    TOBACCO USER     Past Surgical History:  Procedure Laterality Date   COLPOSCOPY  Age 73   LEEP  2001   CIN I, CIN II   WISDOM TOOTH EXTRACTION  Age 57    Prior to Admission medications   Medication Sig Start Date End Date Taking? Authorizing Provider  Drospirenone  (SLYND ) 4 MG TABS Take 1 tablet (4 mg total) by mouth daily. 03/22/24  Yes Hines, Raelyn Bulls, MD  Ginger, Zingiber officinalis, (GINGER ROOT PO) Take by mouth.   Yes [provider]  omeprazole  (PRILOSEC) 40 MG capsule TAKE 1 CAPSULE(40 MG) BY MOUTH DAILY 03/13/24  Yes Zilphia Hilt, Charyl Coppersmith, MD  OVER THE COUNTER MEDICATION daily. Align probiotic.   Yes [provider]  Turmeric (QC TUMERIC COMPLEX PO) Take  by mouth.   Yes [provider]  VITAMIN D  PO Take by mouth.   Yes [provider]  Multiple Vitamins-Minerals (MULTIVITAMIN PO) Take by mouth. Patient not taking: Reported on 03/06/2024    [provider]    Current Outpatient Medications  Medication Sig Dispense Refill   Drospirenone  (SLYND ) 4 MG TABS Take 1 tablet (4 mg total) by mouth daily. 84 tablet 3   Ginger, Zingiber officinalis, (GINGER ROOT PO) Take by mouth.     omeprazole  (PRILOSEC) 40 MG capsule TAKE 1 CAPSULE(40 MG) BY MOUTH DAILY 90 capsule 1   OVER THE COUNTER MEDICATION daily. Align probiotic.     Turmeric (QC TUMERIC COMPLEX PO) Take by mouth.     VITAMIN D  PO Take by mouth.     Multiple Vitamins-Minerals (MULTIVITAMIN PO) Take by mouth. (Patient not taking: Reported on 03/06/2024)     Current Facility-Administered Medications  Medication Dose Route Frequency Provider Last Rate Last Admin   0.9 %  sodium chloride  infusion  500 mL Intravenous Once Nilton Lave C, MD        Allergies as of 03/26/2024   (No Known Allergies)    Family History  Problem Relation Age of Onset   Depression Mother    Lung cancer Father 67   Cancer Father    Depression Father    Diabetes Father    Heart disease  Father    Hyperlipidemia Father    Hypertension Father    Leukemia Sister 28       CML   Cancer Sister    Depression Sister    Mental illness Sister    Miscarriages / [place] Sister    Colon cancer Maternal Grandmother    Arthritis Maternal Grandmother    Stomach cancer Maternal Grandfather    Diabetes Paternal Grandmother    Colon cancer Other        pat great aunt   Esophageal cancer Neg Hx    Rectal cancer Neg Hx     Social History   Socioeconomic History   Marital status: Single    Spouse name: Not on file   Number of children: 0   Years of education: Not on file   Highest education level: Not on file  Occupational History   Occupation: accountant  Tobacco Use   Smoking  status: Former    Current packs/day: 0.00    Average packs/day: 0.5 packs/day for 15.0 years (7.5 ttl pk-yrs)    Types: Cigarettes    Start date: 01/06/2006    Quit date: 01/06/2021    Years since quitting: 3.2   Smokeless tobacco: Current   Tobacco comments:    Chews nicoret  Vaping Use   Vaping status: Never Used  Substance and Sexual Activity   Alcohol  use: Yes    Comment: occasionally   Drug use: Yes    Frequency: 1.0 times per week    Types: Marijuana    Comment: weekends-Last on Friday   Sexual activity: Yes    Partners: Male    Birth control/protection: Pill    Comment: Menarche @ 16, First IC @ 16, Hx of HPV  Other Topics Concern   Not on file  Social History Narrative   Not on file   Social Drivers of Health   Financial Resource Strain: Not on file  Food Insecurity: Not on file  Transportation Needs: Not on file  Physical Activity: Not on file  Stress: Not on file  Social Connections: Not on file  Intimate Partner Violence: Not on file    Physical Exam: Vital signs in last 24 hours: BP 114/80   Pulse 99   Temp 97.9 F (36.6 C) (Skin)   Ht 5\' 5"  (1.651 m)   Wt 177 lb 6.4 oz (80.5 kg)   SpO2 97%   BMI 29.52 kg/m  GEN: NAD EYE: Sclerae anicteric ENT: MMM CV: Non-tachycardic Pulm: No increased WOB GI: Soft NEURO:  Alert & Oriented   Regino Caprio, MD [facility]   03/26/2024 9:21 AM

## 2024-03-26 NOTE — Progress Notes (Signed)
 Report to PACU, RN, vss, BBS= Clear.

## 2024-03-26 NOTE — Progress Notes (Signed)
 Pt's states no medical or surgical changes since previsit or office visit.

## 2024-03-26 NOTE — Progress Notes (Signed)
 Called to room to assist during endoscopic procedure.  Patient ID and intended procedure confirmed with present staff. Received instructions for my participation in the procedure from the performing physician.

## 2024-03-26 NOTE — Op Note (Signed)
 [facility] Patient Name: Heather Mclean Procedure Date: 03/26/2024 9:21 AM MRN: [id number] Endoscopist: Freada Jacobs [place] , , [id number] Age: 43 Referring MD:  Date of Birth: 10/10/1981 Gender: Female Account #: [id number] Procedure:                Colonoscopy Indications:              Change in bowel habits Medicines:                Monitored Anesthesia Care Procedure:                Pre-Anesthesia Assessment:                           - Prior to the procedure, a History and Physical                            was performed, and patient medications and                            allergies were reviewed. The patient's tolerance of                            previous anesthesia was also reviewed. The risks                            and benefits of the procedure and the sedation                            options and risks were discussed with the patient.                            All questions were answered, and informed consent                            was obtained. Prior Anticoagulants: The patient has                            taken no anticoagulant or antiplatelet agents. ASA                            Grade Assessment: II - A patient with mild systemic                            disease. After reviewing the risks and benefits,                            the patient was deemed in satisfactory condition to                            undergo the procedure.                           After obtaining informed consent, the colonoscope  was passed under direct vision. Throughout the                            procedure, the patient's blood pressure, pulse, and                            oxygen saturations were monitored continuously. The                            CF [id number] #[id number] was introduced through the anus                            and advanced to the the terminal ileum. The                            colonoscopy was performed  without difficulty. The                            patient tolerated the procedure well. The quality                            of the bowel preparation was excellent. The                            terminal ileum, ileocecal valve, appendiceal                            orifice, and rectum were photographed. Scope In: 9:33:49 AM Scope Out: 9:45:02 AM Scope Withdrawal Time: 0 hours 7 minutes 56 seconds  Total Procedure Duration: 0 hours 11 minutes 13 seconds  Findings:                 The terminal ileum appeared normal.                           Non-bleeding internal hemorrhoids were found during                            retroflexion.                           Biopsies for histology were taken with a cold                            forceps from the entire colon for evaluation of                            microscopic colitis. Complications:            No immediate complications. Estimated Blood Loss:     Estimated blood loss was minimal. Impression:               - The examined portion of the ileum was normal.                           - Non-bleeding internal hemorrhoids.                           -  Biopsies were taken with a cold forceps from the                            entire colon for evaluation of microscopic colitis. Recommendation:           - Discharge patient to home (with escort).                           - Await pathology results.                           - Consideer starting a daily fiber supplement.                           - The findings and recommendations were discussed                            with the patient. Dr Pedro Bourgeois "[place]" [place],  03/26/2024 9:49:41 AM

## 2024-03-27 ENCOUNTER — Telehealth: Payer: Self-pay

## 2024-03-27 NOTE — Telephone Encounter (Signed)
  Follow up Call-     03/26/2024    8:24 AM  Call back number  Post procedure Call Back phone  # [phone]  Permission to leave phone message Yes     Patient questions:  Do you have a fever, pain , or abdominal swelling? No. Pain Score  0 *  Have you tolerated food without any problems? Yes.    Have you been able to return to your normal activities? Yes.    Do you have any questions about your discharge instructions: Diet   No. Medications  No. Follow up visit  No.  Do you have questions or concerns about your Care? No.  Actions: * If pain score is 4 or above: No action needed, pain <4.

## 2024-03-28 LAB — SURGICAL PATHOLOGY

## 2024-03-29 ENCOUNTER — Encounter: Payer: Self-pay | Admitting: Internal Medicine

## 2024-04-02 ENCOUNTER — Encounter: Payer: Self-pay | Admitting: Internal Medicine

## 2024-04-25 ENCOUNTER — Ambulatory Visit (INDEPENDENT_AMBULATORY_CARE_PROVIDER_SITE_OTHER): Admitting: Internal Medicine

## 2024-04-25 ENCOUNTER — Encounter: Payer: Self-pay | Admitting: Internal Medicine

## 2024-04-25 ENCOUNTER — Ambulatory Visit: Attending: Internal Medicine

## 2024-04-25 VITALS — BP 120/83 | HR 94 | Temp 98.4°F | Ht 65.0 in | Wt 181.1 lb

## 2024-04-25 DIAGNOSIS — H6122 Impacted cerumen, left ear: Secondary | ICD-10-CM

## 2024-04-25 DIAGNOSIS — R002 Palpitations: Secondary | ICD-10-CM

## 2024-04-25 DIAGNOSIS — R000 Tachycardia, unspecified: Secondary | ICD-10-CM | POA: Diagnosis not present

## 2024-04-25 NOTE — Progress Notes (Unsigned)
 EP to read.

## 2024-04-25 NOTE — Progress Notes (Signed)
 Established Patient Office Visit     CC/Reason for Visit: Palpitations  HPI: Heather Mclean is a 43 y.o. female who is coming in today for the above mentioned reasons.  She has noted several instances of palpitations over the last few months.  She states frequency is about every 14 days or so.  She has not noticed any pattern to them: They can happen at rest or while exercising.   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 Her smoking use included cigarettes. She started smoking about 18 years ago. She has a 7.5 pack-year smoking history. She uses smokeless tobacco. She reports current alcohol  use. She reports current drug use. Frequency: 1.00 time per week. Drug: Marijuana.  Allergies: No Known Allergies  Family History:  Family History  Problem Relation Age of Onset   Depression Mother    Lung cancer Father 57   Cancer Father    Depression Father    Diabetes Father    Heart disease Father    Hyperlipidemia Father    Hypertension Father    Leukemia Sister 80       CML   Cancer Sister    Depression Sister    Mental illness Sister    Miscarriages / Stillbirths Sister    Colon cancer Maternal Grandmother    Arthritis Maternal Grandmother    Stomach cancer Maternal Grandfather    Diabetes Paternal Grandmother    Colon cancer Other        pat great aunt   Esophageal cancer Neg Hx    Rectal cancer Neg Hx      Current Outpatient  Medications:    Drospirenone  (SLYND ) 4 MG TABS, Take 1 tablet (4 mg total) by mouth daily., Disp: 84 tablet, Rfl: 3   Ginger, Zingiber officinalis, (GINGER ROOT PO), Take by mouth., Disp: , Rfl:    Multiple Vitamins-Minerals (MULTIVITAMIN PO), Take by mouth., Disp: , Rfl:    omeprazole  (PRILOSEC) 40 MG capsule, TAKE 1 CAPSULE(40 MG) BY MOUTH DAILY, Disp: 90 capsule, Rfl: 1   Turmeric (QC TUMERIC COMPLEX PO), Take by mouth., Disp: , Rfl:    VITAMIN D  PO, Take by mouth., Disp: , Rfl:    OVER THE COUNTER MEDICATION, daily. Align probiotic. (Patient not taking: Reported on 04/25/2024), Disp: , Rfl:   Review of Systems:  Negative unless indicated in HPI.   Physical Exam: Vitals:   04/25/24 1113  BP: 120/83  Pulse: 94  Temp: 98.4 F (36.9 C)  TempSrc: Oral  SpO2: 96%  Weight: 181 lb 1.6 oz (82.1 kg)  Height: 5\' 5"  (1.651 m)    Body mass index is 30.14 kg/m.   Physical Exam Cardiovascular:     Rate and Rhythm: Normal rate and regular rhythm.  Pulmonary:     Effort: Pulmonary  effort is normal.     Breath sounds: Normal breath sounds.      Impression and Plan:  Tachycardia -     EKG 12-Lead  Palpitations -     LONG TERM MONITOR (3-14 DAYS); Future   - EKG done in office today shows normal sinus rhythm at a rate of 69. - Recent TSH was normal. - Will send for 14-day Zio patch, cardiology referral pending results.  Time spent:30 minutes reviewing chart, interviewing and examining patient and formulating plan of care.     Marguerita Shih, MD [facility]

## 2024-04-25 NOTE — Addendum Note (Signed)
 Addended by: Nicolina Barrios B on: 04/25/2024 11:57 AM   Modules accepted: Orders

## 2024-05-16 ENCOUNTER — Encounter (INDEPENDENT_AMBULATORY_CARE_PROVIDER_SITE_OTHER): Payer: Self-pay

## 2024-05-17 ENCOUNTER — Encounter: Payer: Self-pay | Admitting: Internal Medicine

## 2024-05-29 ENCOUNTER — Ambulatory Visit
Admission: RE | Admit: 2024-05-29 | Discharge: 2024-05-29 | Disposition: A | Discharge status: Home / Self Care | Source: Ambulatory Visit | Attending: Obstetrics and Gynecology | Admitting: Obstetrics and Gynecology

## 2024-05-29 ENCOUNTER — Ambulatory Visit: Payer: Self-pay | Admitting: Obstetrics and Gynecology

## 2024-05-29 ENCOUNTER — Ambulatory Visit
Admission: RE | Admit: 2024-05-29 | Discharge: 2024-05-29 | Discharge status: Home / Self Care | Source: Ambulatory Visit | Attending: Obstetrics and Gynecology

## 2024-05-29 DIAGNOSIS — N644 Mastodynia: Secondary | ICD-10-CM

## 2024-05-29 DIAGNOSIS — R9230 Dense breasts, unspecified: Secondary | ICD-10-CM

## 2024-05-30 DIAGNOSIS — R002 Palpitations: Secondary | ICD-10-CM

## 2024-06-14 ENCOUNTER — Encounter: Payer: Self-pay | Admitting: Internal Medicine

## 2024-10-30 ENCOUNTER — Other Ambulatory Visit: Payer: Self-pay | Admitting: Internal Medicine

## 2024-10-30 DIAGNOSIS — K219 Gastro-esophageal reflux disease without esophagitis: Secondary | ICD-10-CM

## 2024-11-13 ENCOUNTER — Encounter: Admitting: Internal Medicine

## 2024-11-26 ENCOUNTER — Ambulatory Visit: Payer: Self-pay

## 2024-11-26 NOTE — Telephone Encounter (Signed)
" °  FYI Only or Action Required?: Action required by provider: request for appointment.  Patient was last seen in primary care on 04/25/2024 by Theophilus Andrews, Tully GRADE, MD.  Called Nurse Triage reporting Facial Pain.  Symptoms began several weeks ago.  Interventions attempted: Rest, hydration, or home remedies.  Symptoms are: unchanged. Had VV with her insurance and started Amoxicillin . Has 4 days left on antibiotic and still not feeling well.   Triage Disposition: See Physician Within 24 Hours     Copied from CRM #[id number]. Topic: Clinical - Red Word Triage >> Nov 26, 2024 12:51 PM Frederich PARAS wrote: [facility] that prompted transfer to Nurse Triage: head pain, sinus pain  about 11 days headache, sinus area sinus pressure gets worst with movement causing pain and discomfort, she had a virtual visit on thursday night she is on day 4 of amoxiciline , they are not working Reason for Disposition  [1] Fever returns after gone for over 24 hours AND [2] symptoms worse or not improved  Answer Assessment - Initial Assessment Questions 1. LOCATION: Where does it hurt?      Headache, nose and eyes, forehead 2. ONSET: When did the sinus pain start?  (e.g., hours, days)      11 days ago 3. SEVERITY: How bad is the pain?   (Scale 0-10; or none, mild, moderate or severe)     moderate 4. RECURRENT SYMPTOM: Have you ever had sinus problems before? If Yes, ask: When was the last time? and What happened that time?      yes 5. NASAL CONGESTION: Is the nose blocked? If Yes, ask: Can you open it or must you breathe through your mouth?     no 6. NASAL DISCHARGE: Do you have discharge from your nose? If so ask, What color?     Mucus clear 7. FEVER: Do you have a fever? If Yes, ask: What is it, how was it measured, and when did it start?      no 8. OTHER SYMPTOMS: Do you have any other symptoms? (e.g., sore throat, cough, earache, difficulty breathing)     no 9. PREGNANCY: Is  there any chance you are pregnant? When was your last menstrual period?     no  Protocols used: Sinus Pain or Congestion-A-AH  "

## 2024-11-27 ENCOUNTER — Ambulatory Visit: Admitting: Family Medicine

## 2024-11-27 ENCOUNTER — Encounter: Payer: Self-pay | Admitting: Family Medicine

## 2024-11-27 VITALS — BP 148/98 | HR 84 | Temp 98.6°F | Wt 181.0 lb

## 2024-11-27 DIAGNOSIS — R030 Elevated blood-pressure reading, without diagnosis of hypertension: Secondary | ICD-10-CM | POA: Diagnosis not present

## 2024-11-27 DIAGNOSIS — J019 Acute sinusitis, unspecified: Secondary | ICD-10-CM

## 2024-11-27 LAB — POCT INFLUENZA A/B
Influenza A, POC: NEGATIVE
Influenza B, POC: NEGATIVE

## 2024-11-27 LAB — POC COVID19 BINAXNOW: SARS Coronavirus 2 Ag: NEGATIVE

## 2024-11-27 MED ORDER — METHYLPREDNISOLONE 4 MG PO TBPK: ORAL_TABLET | ORAL | 0 refills | Status: DC

## 2024-11-27 MED ORDER — AMOXICILLIN-POT CLAVULANATE 875-125 MG PO TABS: 1.0000 | ORAL_TABLET | Freq: Two times a day (BID) | ORAL | 0 refills | Status: DC

## 2024-11-27 NOTE — Addendum Note (Signed)
 Addended by: LADONNA INOCENTE SAILOR on: 11/27/2024 04:30 PM   Modules accepted: Orders

## 2024-11-27 NOTE — Progress Notes (Signed)
" ° °  Subjective:    Patient ID: Heather Mclean, female    DOB: 11-16-1981, 43 y.o.   MRN: [id number]  HPI Here for 2 weeks of pressure in the forehead and in both cheeks as well as mild headaches. No fever or ST or cough. She has been using Flonase and Tylenol at home. She had a televsisit 4 days ago, and she was prescribed Amoxicillin . She does not feel any better so she came in today. We note her BP is up as well, and she has never had a high BP before. This was 120/83 when she was last here in May.    Review of Systems  Constitutional: Negative.   HENT:  Positive for congestion and sinus pressure. Negative for ear pain, postnasal drip and sore throat.   Eyes: Negative.   Respiratory: Negative.    Neurological:  Positive for dizziness and headaches.       Objective:   Physical Exam Constitutional:      Appearance: Normal appearance.  HENT:     Right Ear: Tympanic membrane, ear canal and external ear normal.     Left Ear: Tympanic membrane, ear canal and external ear normal.     Nose: Nose normal.     Mouth/Throat:     Pharynx: Oropharynx is clear.  Eyes:     Conjunctiva/sclera: Conjunctivae normal.     Pupils: Pupils are equal, round, and reactive to light.  Cardiovascular:     Rate and Rhythm: Normal rate and regular rhythm.     Pulses: Normal pulses.     Heart sounds: Normal heart sounds.  Pulmonary:     Effort: Pulmonary effort is normal.     Breath sounds: Normal breath sounds.  Lymphadenopathy:     Cervical: No cervical adenopathy.  Neurological:     Mental Status: She is alert.           Assessment & Plan:  She has a sinusitis and we will stop the Amoxicillin . Instead she will take 10 days of Augmentin  as well as a Medrol  dose pack. Her BP is likely up because she feels bad, but she will set up a return visit with us  next week to recheck this.  Garnette Olmsted, MD   "

## 2024-12-03 ENCOUNTER — Encounter: Payer: Self-pay | Admitting: Internal Medicine

## 2024-12-05 ENCOUNTER — Ambulatory Visit (INDEPENDENT_AMBULATORY_CARE_PROVIDER_SITE_OTHER): Admitting: Internal Medicine

## 2024-12-05 ENCOUNTER — Encounter: Payer: Self-pay | Admitting: Internal Medicine

## 2024-12-05 VITALS — BP 122/82 | HR 87 | Temp 98.3°F | Ht 65.0 in | Wt 181.2 lb

## 2024-12-05 DIAGNOSIS — J019 Acute sinusitis, unspecified: Secondary | ICD-10-CM

## 2024-12-05 NOTE — Progress Notes (Signed)
 "    Established Patient Office Visit     CC/Reason for Visit: Follow-up acute sinusitis  HPI: Heather Mclean is a 43 y.o. female who is coming in today for the above mentioned reasons.  Seen last week for acute sinusitis due to sinus pain pressure and headache.  Given Augmentin .  Here today as she feels her symptoms have not improved.  Just yesterday she started Zyrtec  and Mucinex.  She has a few more days of Augmentin  remaining.   Past Medical/Surgical History: 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   Alcohol  addiction (HCC)    AMENORRHEA    Anxiety    Depression    Eating disorder    GERD (gastroesophageal reflux disease)    HPV (human papilloma virus) anogenital infection Age 64   SINUSITIS- ACUTE-NOS    SORE THROAT    TOBACCO USER     Past Surgical History:  Procedure Laterality Date   COLPOSCOPY  Age 38   LEEP  2001   CIN I, CIN II   WISDOM TOOTH EXTRACTION  Age 48    Social History:  reports that she quit smoking about 3 years ago. Her smoking use included cigarettes. She started smoking about 18 years ago. She has a 7.5 pack-year smoking history. She uses smokeless tobacco. She reports current alcohol  use. She reports current drug use. Frequency: 1.00 time per week. Drug: Marijuana.  Allergies: Allergies[1]  Family History:  Family History  Problem Relation Age of Onset   Depression Mother    Lung cancer Father 51   Cancer Father    Depression Father    Diabetes Father    Heart disease Father    Hyperlipidemia Father    Hypertension Father    Leukemia Sister 27       CML   Cancer Sister    Depression Sister    Mental illness Sister    Miscarriages / Stillbirths Sister    Colon cancer Maternal Grandmother    Arthritis Maternal Grandmother    Stomach cancer Maternal Grandfather    Diabetes Paternal Grandmother    Colon cancer Other        pat great aunt   Esophageal cancer Neg Hx    Rectal  cancer Neg Hx     Current Medications[2]  Review of Systems:  Negative unless indicated in HPI.   Physical Exam: Vitals:   12/05/24 0657  BP: 122/82  Pulse: 87  Temp: 98.3 F (36.8 C)  TempSrc: Oral  SpO2: 98%  Weight: 181 lb 3.2 oz (82.2 kg)  Height: 5' 5 (1.651 m)    Body mass index is 30.15 kg/m.   Physical Exam Vitals reviewed.  Constitutional:      Appearance: Normal appearance.  HENT:     Right Ear: Ear canal and external ear normal. A middle ear effusion is present.     Left Ear: Tympanic membrane, ear canal and external ear normal. There is impacted cerumen.     Mouth/Throat:     Mouth: Mucous membranes are moist.     Pharynx: Oropharynx is clear.  Eyes:     Conjunctiva/sclera: Conjunctivae normal.     Pupils: Pupils are equal, round, and reactive to light.  Cardiovascular:     Rate and Rhythm: Normal rate and regular rhythm.  Pulmonary:     Effort: Pulmonary effort is normal.     Breath sounds: Normal breath sounds.  Neurological:  Mental Status: She is alert.      Impression and Plan:  Acute non-recurrent sinusitis, unspecified location   -Given exam findings, PNA, pharyngitis, ear infection are not likely.  Advised to complete out course of Augmentin  as prescribed. -Have advised rest, fluids, OTC antihistamines, cough suppressants and mucinex. -RTC if no improvement in 10-14 days.   Time spent:22 minutes reviewing chart, interviewing and examining patient and formulating plan of care.     Tully Theophilus Andrews, MD [facility]     [1] No Known Allergies [2]  Current Outpatient Medications:    amoxicillin -clavulanate (AUGMENTIN ) 875-125 MG tablet, Take 1 tablet by mouth 2 (two) times daily., Disp: 20 tablet, Rfl: 0   Ginger, Zingiber officinalis, (GINGER ROOT PO), Take by mouth., Disp: , Rfl:    omeprazole  (PRILOSEC) 40 MG capsule, TAKE 1 CAPSULE(40 MG) BY MOUTH DAILY, Disp: 90 capsule, Rfl: 0   Turmeric  (QC TUMERIC COMPLEX PO), Take by mouth., Disp: , Rfl:    VITAMIN D  PO, Take by mouth., Disp: , Rfl:   "

## 2024-12-12 ENCOUNTER — Encounter: Payer: Self-pay | Admitting: Internal Medicine

## 2024-12-12 ENCOUNTER — Ambulatory Visit: Admitting: Internal Medicine

## 2024-12-12 VITALS — BP 120/84 | HR 94 | Temp 98.3°F | Ht 66.0 in | Wt 181.9 lb

## 2024-12-12 DIAGNOSIS — E782 Mixed hyperlipidemia: Secondary | ICD-10-CM | POA: Diagnosis not present

## 2024-12-12 DIAGNOSIS — Z0000 Encounter for general adult medical examination without abnormal findings: Secondary | ICD-10-CM

## 2024-12-12 DIAGNOSIS — E559 Vitamin D deficiency, unspecified: Secondary | ICD-10-CM

## 2024-12-12 DIAGNOSIS — R002 Palpitations: Secondary | ICD-10-CM

## 2024-12-12 DIAGNOSIS — R030 Elevated blood-pressure reading, without diagnosis of hypertension: Secondary | ICD-10-CM

## 2024-12-12 DIAGNOSIS — J302 Other seasonal allergic rhinitis: Secondary | ICD-10-CM | POA: Diagnosis not present

## 2024-12-12 DIAGNOSIS — Z0100 Encounter for examination of eyes and vision without abnormal findings: Secondary | ICD-10-CM

## 2024-12-12 LAB — CBC WITH DIFFERENTIAL/PLATELET
Basophils Absolute: 0 K/uL (ref 0.0–0.1)
Basophils Relative: 0.3 % (ref 0.0–3.0)
Eosinophils Absolute: 0.1 K/uL (ref 0.0–0.7)
Eosinophils Relative: 1.4 % (ref 0.0–5.0)
HCT: 41.5 % (ref 36.0–46.0)
Hemoglobin: 13.7 g/dL (ref 12.0–15.0)
Lymphocytes Relative: 24 % (ref 12.0–46.0)
Lymphs Abs: 1.2 K/uL (ref 0.7–4.0)
MCHC: 32.9 g/dL (ref 30.0–36.0)
MCV: 88.5 fl (ref 78.0–100.0)
Monocytes Absolute: 0.5 K/uL (ref 0.1–1.0)
Monocytes Relative: 9.8 % (ref 3.0–12.0)
Neutro Abs: 3.3 K/uL (ref 1.4–7.7)
Neutrophils Relative %: 64.5 % (ref 43.0–77.0)
Platelets: 271 K/uL (ref 150.0–400.0)
RBC: 4.69 Mil/uL (ref 3.87–5.11)
RDW: 13.3 % (ref 11.5–15.5)
WBC: 5.1 K/uL (ref 4.0–10.5)

## 2024-12-12 LAB — COMPREHENSIVE METABOLIC PANEL WITH GFR
ALT: 14 U/L (ref 3–35)
AST: 17 U/L (ref 5–37)
Albumin: 4.3 g/dL (ref 3.5–5.2)
Alkaline Phosphatase: 54 U/L (ref 39–117)
BUN: 10 mg/dL (ref 6–23)
CO2: 27 meq/L (ref 19–32)
Calcium: 9.1 mg/dL (ref 8.4–10.5)
Chloride: 103 meq/L (ref 96–112)
Creatinine, Ser: 0.62 mg/dL (ref 0.40–1.20)
GFR: 109.98 mL/min
Glucose, Bld: 91 mg/dL (ref 70–99)
Potassium: 3.9 meq/L (ref 3.5–5.1)
Sodium: 138 meq/L (ref 135–145)
Total Bilirubin: 0.4 mg/dL (ref 0.2–1.2)
Total Protein: 7 g/dL (ref 6.0–8.3)

## 2024-12-12 LAB — LIPID PANEL
Cholesterol: 194 mg/dL (ref 28–200)
HDL: 53.1 mg/dL
LDL Cholesterol: 115 mg/dL — ABNORMAL HIGH (ref 10–99)
NonHDL: 140.44
Total CHOL/HDL Ratio: 4
Triglycerides: 129 mg/dL (ref 10.0–149.0)
VLDL: 25.8 mg/dL (ref 0.0–40.0)

## 2024-12-12 LAB — VITAMIN B12: Vitamin B-12: 197 pg/mL — ABNORMAL LOW (ref 211–911)

## 2024-12-12 LAB — TSH: TSH: 0.95 u[IU]/mL (ref 0.35–5.50)

## 2024-12-12 LAB — VITAMIN D 25 HYDROXY (VIT D DEFICIENCY, FRACTURES): VITD: 18.07 ng/mL — ABNORMAL LOW (ref 30.00–100.00)

## 2024-12-12 NOTE — Progress Notes (Signed)
 "    Established Patient Office Visit     CC/Reason for Visit: Annual preventive exam, discuss acute concerns  HPI: Heather Mclean is a 44 y.o. female who is coming in today for the above mentioned reasons. Past Medical History is significant for: Vitamin D  deficiency and GERD.  She continues to have issues with postnasal drip and sinus congestion.  She is requesting referral to ophthalmology allergy.  She continues to have issues with palpitations.  She is due for labs.  Cancer screening is up-to-date.   Past Medical/Surgical History: 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   Alcohol  addiction (HCC)    AMENORRHEA    Anxiety    Depression    Eating disorder    GERD (gastroesophageal reflux disease)    HPV (human papilloma virus) anogenital infection Age 73   SINUSITIS- ACUTE-NOS    SORE THROAT    TOBACCO USER     Past Surgical History:  Procedure Laterality Date   COLPOSCOPY  Age 37   LEEP  2001   CIN I, CIN II   WISDOM TOOTH EXTRACTION  Age 6    Social History:  reports that she quit smoking about 3 years ago. Her smoking use included cigarettes. She started smoking about 18 years ago. She has a 7.5 pack-year smoking history. She uses smokeless tobacco. She reports current alcohol  use. She reports current drug use. Frequency: 1.00 time per week. Drug: Marijuana.  Allergies: Allergies[1]  Family History:  Family History  Problem Relation Age of Onset   Depression Mother    Lung cancer Father 53   Cancer Father    Depression Father    Diabetes Father    Heart disease Father    Hyperlipidemia Father    Hypertension Father    Leukemia Sister 21       CML   Cancer Sister    Depression Sister    Mental illness Sister    Miscarriages / Stillbirths Sister    Colon cancer Maternal Grandmother    Arthritis Maternal Grandmother    Stomach cancer Maternal Grandfather    Diabetes Paternal Grandmother    Colon  cancer Other        pat great aunt   Esophageal cancer Neg Hx    Rectal cancer Neg Hx     Current Medications[2]  Review of Systems:  Negative unless indicated in HPI.   Physical Exam: Vitals:   12/12/24 1003  BP: 120/84  Pulse: 94  Temp: 98.3 F (36.8 C)  TempSrc: Oral  SpO2: 98%  Weight: 181 lb 14.4 oz (82.5 kg)  Height: 5' 6 (1.676 m)    Body mass index is 29.36 kg/m.   Physical Exam Vitals reviewed.  Constitutional:      General: She is not in acute distress.    Appearance: Normal appearance. She is not ill-appearing, toxic-appearing or diaphoretic.  HENT:     Head: Normocephalic.     Right Ear: Tympanic membrane, ear canal and external ear normal. There is no impacted cerumen.     Left Ear: Tympanic membrane, ear canal and external ear normal. There is no impacted cerumen.     Nose: Nose normal.     Mouth/Throat:     Mouth: Mucous membranes are moist.     Pharynx: Oropharynx is clear. No oropharyngeal exudate or posterior oropharyngeal erythema.  Eyes:     General: No scleral icterus.  Right eye: No discharge.        Left eye: No discharge.     Conjunctiva/sclera: Conjunctivae normal.     Pupils: Pupils are equal, round, and reactive to light.  Neck:     Vascular: No carotid bruit.  Cardiovascular:     Rate and Rhythm: Normal rate and regular rhythm.     Pulses: Normal pulses.     Heart sounds: Normal heart sounds.  Pulmonary:     Effort: Pulmonary effort is normal. No respiratory distress.     Breath sounds: Normal breath sounds.  Abdominal:     General: Abdomen is flat. Bowel sounds are normal.     Palpations: Abdomen is soft.  Musculoskeletal:        General: Normal range of motion.     Cervical back: Normal range of motion.  Skin:    General: Skin is warm and dry.  Neurological:     General: No focal deficit present.     Mental Status: She is alert and oriented to person, place, and time. Mental status is at baseline.  Psychiatric:         Mood and Affect: Mood normal.        Behavior: Behavior normal.        Thought Content: Thought content normal.        Judgment: Judgment normal.      Impression and Plan:  Well adult exam  Vitamin D  deficiency -     VITAMIN D  25 Hydroxy (Vit-D Deficiency, Fractures); Future  Mixed hyperlipidemia -     CBC with Differential/Platelet; Future -     Comprehensive metabolic panel with GFR; Future -     Lipid panel; Future -     TSH; Future -     Vitamin B12; Future  Palpitations -     Ambulatory referral to Cardiology  Seasonal allergies -     Ambulatory referral to Allergy  Encounter for vision screening -     Ambulatory referral to Ophthalmology   -Recommend routine eye and dental care. -Healthy lifestyle discussed in detail. -Labs to be updated today. -Prostate cancer screening: Not applicable Health Maintenance  Topic Date Due   Pneumococcal Vaccine (1 of 2 - PCV) Never done   Hepatitis B Vaccine (1 of 3 - 19+ 3-dose series) Never done   COVID-19 Vaccine (5 - 2025-26 season) 08/06/2024   Flu Shot  03/05/2025*   DTaP/Tdap/Td vaccine (10 - Td or Tdap) 10/23/2025   Breast Cancer Screening  05/29/2026   Pap with HPV screening  12/25/2028   HPV Vaccine  Completed   Hepatitis C Screening  Completed   HIV Screening  Completed   Meningitis B Vaccine  Aged Out  *Topic was postponed. The date shown is not the original due date.     -Advised flu and COVID vaccines but declines today. - Referral to ophthalmology placed for routine vision screening. - Referral to allergy placed due to continued seasonal allergies and sinus pain and pressure. - Referral to cardiology per request for further workup of palpitations.  She has worn a monitor that did not show any arrhythmias.    Tully Theophilus Andrews, MD [facility]     [1] No Known Allergies [2]  Current Outpatient Medications:    cetirizine  (ZYRTEC  ALLERGY) 10 MG tablet, Take 10 mg by  mouth daily., Disp: , Rfl:    dextromethorphan-guaiFENesin (MUCINEX DM) 30-600 MG 12hr tablet, Take 1 tablet by mouth 2 (two)  times daily., Disp: , Rfl:    Ginger, Zingiber officinalis, (GINGER ROOT PO), Take by mouth., Disp: , Rfl:    omeprazole  (PRILOSEC) 40 MG capsule, TAKE 1 CAPSULE(40 MG) BY MOUTH DAILY, Disp: 90 capsule, Rfl: 0   Turmeric (QC TUMERIC COMPLEX PO), Take by mouth., Disp: , Rfl:    VITAMIN D  PO, Take by mouth., Disp: , Rfl:   "

## 2024-12-25 ENCOUNTER — Ambulatory Visit: Payer: Self-pay

## 2024-12-25 NOTE — Telephone Encounter (Signed)
 Noted.

## 2024-12-25 NOTE — Telephone Encounter (Signed)
" °  FYI Only or Action Required?: FYI only for provider: UC.  Patient was last seen in primary care on 12/12/2024 by Theophilus Andrews, Tully GRADE, MD.  Called Nurse Triage reporting Vomiting.  Symptoms began yesterday.  Interventions attempted: Nothing.  Symptoms are: unchanged.  Triage Disposition: See HCP Within 4 Hours (Or PCP Triage)  Patient/caregiver understands and will follow disposition?: Yes Reason for Disposition  [1] Vomiting AND [2] contains bile (green color)  Answer Assessment - Initial Assessment Questions No available appts. Advised UC now and ED/911 if symptoms worsen. Patient verbalized understanding.  Appt previously scheduled 12/26/24.  1. VOMITING SEVERITY: How many times have you vomited in the past 24 hours?       Vomited; 24 hours: 4x; Yellow color emesis, at 0500, brownish color; last emesis;   Nausea, fullness feeling, acid reflux, body aches No blood in emesis/stool, abd pain, bloated, not hard touch, fever  Took omeprazole , now taking sips of water  2. ONSET: When did the vomiting begin?      Started yesterday, lunch 3. FLUIDS: What fluids or food have you vomited up today? Have you been able to keep any fluids down?     Taking sips of water, able to keep down; have not ate yet 4. ABDOMEN PAIN: Are your having any abdomen pain? If Yes : How bad is it and what does it feel like? (e.g., crampy, dull, intermittent, constant)      denies 5. DIARRHEA: Is there any diarrhea? If Yes, ask: How many times today? Diarrhea; 4x yellow, watery      6. CONTACTS: Is there anyone else in the family with the same symptoms?      denies 7. CAUSE: What do you think is causing your vomiting?     Food at lunch 8. HYDRATION STATUS: Any signs of dehydration? (e.g., dry mouth [not only dry lips], too weak to stand) When did you last urinate?     Denies dizziness, dry mouth;  Last urinated few minutes ago, normal urine output 9. OTHER SYMPTOMS: Do  you have any other symptoms? (e.g., fever, headache, vertigo, vomiting blood or coffee grounds, recent head injury)     HA, body aches denies vomiting blood or coffee grounds, diff breath, chest/arm/shoulder/upper back pain, faint, abd pain  Protocols used: Vomiting-A-AH  "

## 2024-12-26 ENCOUNTER — Encounter: Payer: Self-pay | Admitting: Internal Medicine

## 2024-12-26 ENCOUNTER — Ambulatory Visit: Admitting: Obstetrics and Gynecology

## 2024-12-26 ENCOUNTER — Telehealth: Admitting: Internal Medicine

## 2024-12-26 VITALS — Wt 180.0 lb

## 2024-12-26 DIAGNOSIS — K529 Noninfective gastroenteritis and colitis, unspecified: Secondary | ICD-10-CM | POA: Insufficient documentation

## 2024-12-26 DIAGNOSIS — K219 Gastro-esophageal reflux disease without esophagitis: Secondary | ICD-10-CM

## 2024-12-26 MED ORDER — PANTOPRAZOLE SODIUM 40 MG PO TBEC: 40.0000 mg | DELAYED_RELEASE_TABLET | Freq: Every day | ORAL | 1 refills | Status: AC

## 2024-12-26 NOTE — Progress Notes (Signed)
 "   Virtual Visit via Video Note  I connected with Heather Mclean on 12/26/24 at  7:00 AM EST by a video enabled telemedicine application and verified that I am speaking with the correct person using two identifiers.  Location patient: home Location provider: work office Persons participating in the virtual visit: patient, provider  I discussed the limitations of evaluation and management by telemedicine and the availability of in person appointments. The patient expressed understanding and agreed to proceed.   HPI: 2 days ago she had a 24-hour what sounds like acute viral gastroenteritis.  Monday evening she started having vomiting and diarrhea every 2 hours.  Diarrhea was very liquidy, brown in color, emesis was without blood initially with food contents and then just bile.  She ended up going to urgent care where she tested negative for flu and COVID.  She also had chills fatigue and headache.  She was given Zofran and Pepcid in the ED and is quickly improved.  Is requesting a change in her GERD management from omeprazole  to pantoprazole .   ROS: Negative unless indicated in HPI.  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   Alcohol  addiction (HCC)    AMENORRHEA    Anxiety    Depression    Eating disorder    GERD (gastroesophageal reflux disease)    HPV (human papilloma virus) anogenital infection Age 44   SINUSITIS- ACUTE-NOS    SORE THROAT    TOBACCO USER     Past Surgical History:  Procedure Laterality Date   COLPOSCOPY  Age 44   LEEP  2001   CIN I, CIN II   WISDOM TOOTH EXTRACTION  Age 44    Family History  Problem Relation Age of Onset   Depression Mother    Lung cancer Father 9   Cancer Father    Depression Father    Diabetes Father    Heart disease Father    Hyperlipidemia Father    Hypertension Father    Leukemia Sister 50       CML   Cancer Sister    Depression Sister    Mental illness Sister     Miscarriages / Stillbirths Sister    Colon cancer Maternal Grandmother    Arthritis Maternal Grandmother    Stomach cancer Maternal Grandfather    Diabetes Paternal Grandmother    Colon cancer Other        pat great aunt   Esophageal cancer Neg Hx    Rectal cancer Neg Hx     SOCIAL HX:   reports that she quit smoking about 3 years ago. Her smoking use included cigarettes. She started smoking about 18 years ago. She has a 7.5 pack-year smoking history. She uses smokeless tobacco. She reports current alcohol  use. She reports current drug use. Frequency: 1.00 time per week. Drug: Marijuana.  Current Medications[1]  EXAM:   VITALS per patient if applicable: None reported  GENERAL: alert, oriented, appears well and in no acute distress  HEENT: atraumatic, conjunttiva clear, no obvious abnormalities on inspection of external nose and ears  NECK: normal movements of the head and neck  LUNGS: on inspection no signs of respiratory distress, breathing rate appears normal, no obvious gross increased work of breathing, gasping or wheezing  CV: no obvious cyanosis  MS: moves all visible extremities without noticeable abnormality  PSYCH/NEURO: pleasant and cooperative, no obvious depression or anxiety, speech and thought processing grossly  intact  ASSESSMENT AND PLAN:   Acute gastroenteritis  Gastroesophageal reflux disease, unspecified whether esophagitis present - Plan: pantoprazole  (PROTONIX ) 40 MG tablet  -Suspect she dealt with an acute viral gastroenteritis that is now resolving.  Agree with liquids with electrolytes and Zofran as needed.  Nothing further. - Agree with switching omeprazole  to pantoprazole .   I discussed the assessment and treatment plan with the patient. The patient was provided an opportunity to ask questions and all were answered. The patient agreed with the plan and demonstrated an understanding of the instructions.   The patient was advised to call back or  seek an in-person evaluation if the symptoms worsen or if the condition fails to improve as anticipated.    Tully Theophilus Andrews, MD  [facility]    [1]  Current Outpatient Medications:    cetirizine  (ZYRTEC  ALLERGY) 10 MG tablet, Take 10 mg by mouth daily., Disp: , Rfl:    dextromethorphan-guaiFENesin (MUCINEX DM) 30-600 MG 12hr tablet, Take 1 tablet by mouth 2 (two) times daily., Disp: , Rfl:    Ginger, Zingiber officinalis, (GINGER ROOT PO), Take by mouth., Disp: , Rfl:    omeprazole  (PRILOSEC) 40 MG capsule, TAKE 1 CAPSULE(40 MG) BY MOUTH DAILY, Disp: 90 capsule, Rfl: 0   pantoprazole  (PROTONIX ) 40 MG tablet, Take 1 tablet (40 mg total) by mouth daily., Disp: 90 tablet, Rfl: 1   Turmeric (QC TUMERIC COMPLEX PO), Take by mouth., Disp: , Rfl:    VITAMIN D  PO, Take by mouth., Disp: , Rfl:   "

## 2024-12-26 NOTE — Progress Notes (Signed)
 Per patient no change in vitals since last visit, unable to obtain new vitals due to telehealth visit.

## 2024-12-27 ENCOUNTER — Ambulatory Visit: Payer: Self-pay | Admitting: Internal Medicine

## 2024-12-27 DIAGNOSIS — E538 Deficiency of other specified B group vitamins: Secondary | ICD-10-CM | POA: Insufficient documentation

## 2024-12-27 DIAGNOSIS — E559 Vitamin D deficiency, unspecified: Secondary | ICD-10-CM

## 2024-12-27 MED ORDER — CYANOCOBALAMIN 1000 MCG/ML IJ SOLN: INTRAMUSCULAR | 11 refills | Status: AC

## 2024-12-27 MED ORDER — "BD SAFETYGLIDE SYRINGE/NEEDLE 25G X 1"" 3 ML MISC": 3 refills | Status: AC

## 2024-12-27 MED ORDER — VITAMIN D (ERGOCALCIFEROL) 1.25 MG (50000 UNIT) PO CAPS: 50000.0000 [IU] | ORAL_CAPSULE | ORAL | 0 refills | Status: AC

## 2025-01-24 ENCOUNTER — Ambulatory Visit: Admitting: Obstetrics and Gynecology

## 2025-02-19 ENCOUNTER — Ambulatory Visit: Admitting: Student
